# Patient Record
Sex: Female | Born: 1955 | Race: White | Hispanic: No | Marital: Married | Smoking: Former smoker
Health system: Southern US, Community
[De-identification: ages and names within clinical notes are randomized; demographics above are authoritative.]

## PROBLEM LIST (undated history)

## (undated) DIAGNOSIS — D249 Benign neoplasm of unspecified breast: Secondary | ICD-10-CM

## (undated) DIAGNOSIS — G479 Sleep disorder, unspecified: Secondary | ICD-10-CM

## (undated) DIAGNOSIS — M1990 Unspecified osteoarthritis, unspecified site: Secondary | ICD-10-CM

## (undated) DIAGNOSIS — F329 Major depressive disorder, single episode, unspecified: Secondary | ICD-10-CM

## (undated) DIAGNOSIS — F3181 Bipolar II disorder: Secondary | ICD-10-CM

## (undated) DIAGNOSIS — F32A Depression, unspecified: Secondary | ICD-10-CM

## (undated) DIAGNOSIS — J449 Chronic obstructive pulmonary disease, unspecified: Secondary | ICD-10-CM

## (undated) HISTORY — DX: Bipolar II disorder: F31.81

## (undated) HISTORY — DX: Chronic obstructive pulmonary disease, unspecified: J44.9

---

## 2014-01-22 ENCOUNTER — Ambulatory Visit: Payer: Self-pay

## 2014-01-22 LAB — URINALYSIS, COMPLETE

## 2014-01-25 ENCOUNTER — Ambulatory Visit: Payer: Self-pay | Admitting: Physician Assistant

## 2014-01-25 LAB — URINE CULTURE

## 2014-01-25 LAB — URINALYSIS, COMPLETE
Bacteria: NEGATIVE
RBC,UR: NONE SEEN /HPF (ref 0–5)

## 2014-02-14 ENCOUNTER — Ambulatory Visit: Payer: Self-pay | Admitting: Ophthalmology

## 2014-04-11 ENCOUNTER — Ambulatory Visit
Admit: 2014-04-11 | Disposition: A | Discharge status: Home / Self Care | Payer: Self-pay | Attending: Ophthalmology | Admitting: Ophthalmology

## 2014-05-07 NOTE — Op Note (Signed)
PATIENT NAME:  Christina Hurst, Christina Hurst MR#:  [id number] DATE OF BIRTH:  1956-01-05  DATE OF PROCEDURE:  02/14/2014  PREOPERATIVE DIAGNOSIS:  Nuclear sclerotic cataract of the right eye.   POSTOPERATIVE DIAGNOSIS:  Nuclear sclerotic cataract of the right eye.   OPERATIVE PROCEDURE:  Cataract extraction by phacoemulsification with implant of intraocular lens to right eye.   SURGEON:  Birder Robson, MD.   ANESTHESIA:  1. Managed anesthesia care.  2. Topical tetracaine drops followed by 2% Xylocaine jelly applied in the preoperative holding area.   COMPLICATIONS:  None.   TECHNIQUE:   Stop and chop.  DESCRIPTION OF PROCEDURE:  The patient was examined and consented in the preoperative holding area where the aforementioned topical anesthesia was applied to the right eye and then brought back to the Operating Room where the right eye was prepped and draped in the usual sterile ophthalmic fashion and a lid speculum was placed. A paracentesis was created with the side port blade and the anterior chamber was filled with viscoelastic. A near clear corneal incision was performed with the steel keratome. A continuous curvilinear capsulorrhexis was performed with a cystotome followed by the capsulorrhexis forceps. Hydrodissection and hydrodelineation were carried out with BSS on a blunt cannula. The lens was removed in a stop and chop technique and the remaining cortical material was removed with the irrigation-aspiration handpiece. The capsular bag was inflated with viscoelastic and the Tecnis ZCB00, 24.0-diopter lens, serial number [id number] was placed in the capsular bag without complication. The remaining viscoelastic was removed from the eye with the irrigation-aspiration handpiece. The wounds were hydrated. The anterior chamber was flushed with Miostat and the eye was inflated to physiologic pressure. 0.1 mL of cefuroxime concentration 10 mg/mL was placed in the anterior chamber. The wounds were found to be  water tight. The eye was dressed with Vigamox. The patient was given protective glasses to wear throughout the day and a shield with which to sleep tonight. The patient was also given drops with which to begin a drop regimen today and will follow-up with me in one day.    ____________________________ Livingston Diones. Ephram Kornegay, MD wlp:TM D: 02/14/2014 53:29:92 ET T: 02/14/2014 22:59:55 ET JOB#: [id number]  cc: Shonita Rinck L. Channa Hazelett, MD, <Dictator> Livingston Diones Jhovany Weidinger MD ELECTRONICALLY SIGNED 02/15/2014 12:12

## 2014-05-07 NOTE — Op Note (Signed)
PATIENT NAME:  [name], Christina Hurst MR#:  [id number] DATE OF BIRTH:  12-Jan-1955  DATE OF PROCEDURE:  04/11/2014  PREOPERATIVE DIAGNOSIS:  Nuclear sclerotic cataract of the left eye.   POSTOPERATIVE DIAGNOSIS:  Nuclear sclerotic cataract of the left eye.   OPERATIVE PROCEDURE:  Cataract extraction by phacoemulsification with implant of intraocular lens to left eye.   SURGEON:  Birder Robson, MD.   ANESTHESIA:  1. Managed anesthesia care.  2. Topical tetracaine drops followed by 2% Xylocaine jelly applied in the preoperative holding area.   COMPLICATIONS:  None.   TECHNIQUE:   Stop and chop.   DESCRIPTION OF PROCEDURE:  The patient was examined and consented in the preoperative holding area where the aforementioned topical anesthesia was applied to the left eye and then brought back to the Operating Room where the left eye was prepped and draped in the usual sterile ophthalmic fashion and a lid speculum was placed. A paracentesis was created with the side port blade and the anterior chamber was filled with viscoelastic. A near clear corneal incision was performed with the steel keratome. A continuous curvilinear capsulorrhexis was performed with a cystotome followed by the capsulorrhexis forceps. Hydrodissection and hydrodelineation were carried out with BSS on a blunt cannula. The lens was removed in a stop and chop technique and the remaining cortical material was removed with the irrigation-aspiration handpiece. The capsular bag was inflated with viscoelastic and the Tecnis ZCB00 24.0-diopter lens, serial number #[id number] was placed in the capsular bag without complication. The remaining viscoelastic was removed from the eye with the irrigation-aspiration handpiece. The wounds were hydrated. The anterior chamber was flushed with Miostat and the eye was inflated to physiologic pressure. 0.1 mL of cefuroxime concentration 10 mg/mL was placed in the anterior chamber. The wounds were found to be  water tight. The eye was dressed with Vigamox. The patient was given protective glasses to wear throughout the day and a shield with which to sleep tonight. The patient was also given drops with which to begin a drop regimen today and will follow-up with me in one day.    ____________________________ Livingston Diones. Bertice Risse, MD wlp:at D: 04/11/2014 12:35:24 ET T: 04/11/2014 13:13:06 ET JOB#: [id number]  cc: Keryl Gholson L. Tlaloc Taddei, MD, <Dictator> Livingston Diones Tonique Mendonca MD ELECTRONICALLY SIGNED 04/12/2014 12:34

## 2014-10-09 DIAGNOSIS — M797 Fibromyalgia: Secondary | ICD-10-CM | POA: Insufficient documentation

## 2015-01-11 ENCOUNTER — Ambulatory Visit
Admission: EM | Admit: 2015-01-11 | Discharge: 2015-01-11 | Disposition: A | Discharge status: Home / Self Care | Payer: Managed Care, Other (non HMO) | Attending: Family Medicine | Admitting: Family Medicine

## 2015-01-11 ENCOUNTER — Encounter: Payer: Self-pay | Admitting: Gynecology

## 2015-01-11 DIAGNOSIS — J45901 Unspecified asthma with (acute) exacerbation: Secondary | ICD-10-CM

## 2015-01-11 DIAGNOSIS — J209 Acute bronchitis, unspecified: Secondary | ICD-10-CM

## 2015-01-11 HISTORY — DX: Benign neoplasm of unspecified breast: D24.9

## 2015-01-11 HISTORY — DX: Major depressive disorder, single episode, unspecified: F32.9

## 2015-01-11 HISTORY — DX: Depression, unspecified: F32.A

## 2015-01-11 HISTORY — DX: Unspecified osteoarthritis, unspecified site: M19.90

## 2015-01-11 HISTORY — DX: Sleep disorder, unspecified: G47.9

## 2015-01-11 LAB — RAPID STREP SCREEN (MED CTR MEBANE ONLY): Streptococcus, Group A Screen (Direct): NEGATIVE

## 2015-01-11 MED ORDER — AZITHROMYCIN 500 MG PO TABS
ORAL_TABLET | ORAL | Status: DC
Start: 2015-01-11 — End: 2015-02-05

## 2015-01-11 MED ORDER — ALBUTEROL SULFATE HFA 108 (90 BASE) MCG/ACT IN AERS: 2.0000 | INHALATION_SPRAY | RESPIRATORY_TRACT | Status: DC | PRN

## 2015-01-11 MED ORDER — HYDROCOD POLST-CPM POLST ER 10-8 MG/5ML PO SUER: 5.0000 mL | Freq: Two times a day (BID) | ORAL | Status: DC | PRN

## 2015-01-11 NOTE — ED Provider Notes (Signed)
CSN: [id number]     Arrival date & time 01/11/15  1442 History   First MD Initiated Contact with Patient 01/11/15 [place]    Nurses notes were reviewed. Chief Complaint  Patient presents with  . Cough      Patient reports coughing for about a week. She stopped smoking about 15 years ago. States that she's been wheezing for the last 2 days coughing up greenish material well. States there was a fever but she's been running a temp up to the 100 lately as well. (Consider location/radiation/quality/duration/timing/severity/associated sxs/prior Treatment) Patient is a 61 y.o. female presenting with cough. The history is provided by the patient. No language interpreter was used.  Cough Cough characteristics:  Productive Sputum characteristics:  Yellow and green Severity:  Moderate Onset quality:  Sudden Progression:  Worsening Chronicity:  New Smoker: no   Context: upper respiratory infection   Context: not animal exposure, not exposure to allergens, not fumes, not occupational exposure, not sick contacts and not smoke exposure   Relieved by:  Nothing Ineffective treatments:  None tried Associated symptoms: fever, myalgias, shortness of breath and sinus congestion   Associated symptoms: no chest pain, no ear fullness, no rash, no rhinorrhea and no sore throat   Risk factors: recent infection     Past Medical History  Diagnosis Date  . Depression   . Sleep disorder   . Arthritis   . Fibroadenoma    Past Surgical History  Procedure Laterality Date  . Cesarean section     No family history on file. Social History  Substance Use Topics  . Smoking status: Never Smoker   . Smokeless tobacco: None  . Alcohol Use: Yes   OB History    No data available     Review of Systems  Constitutional: Positive for fever.  HENT: Negative for rhinorrhea and sore throat.   Respiratory: Positive for cough and shortness of breath.   Cardiovascular: Negative for chest pain.  Musculoskeletal:  Positive for myalgias.  Skin: Negative for rash.    Allergies  Review of patient's allergies indicates no known allergies.  Home Medications   Prior to Admission medications   Medication Sig Start Date End Date Taking? Authorizing Provider  albuterol (PROVENTIL HFA;VENTOLIN HFA) 108 (90 Base) MCG/ACT inhaler Inhale 2 puffs into the lungs every 4 (four) hours as needed for wheezing or shortness of breath. 01/11/15   Frederich Cha, MD  azithromycin (ZITHROMAX) 500 MG tablet 1 tablet daily 01/11/15   Frederich Cha, MD  chlorpheniramine-HYDROcodone [facility]) 10-8 MG/5ML SUER Take 5 mLs by mouth every 12 (twelve) hours as needed for cough. 01/11/15   Frederich Cha, MD   Meds Ordered and Administered this Visit  Medications - No data to display  BP 113/67 mmHg  Pulse 82  Temp(Src) 97.8 F (36.6 C) (Oral)  Resp 16  Ht 5\' 4"  (1.626 m)  Wt 150 lb (68.04 kg)  BMI 25.73 kg/m2  SpO2 99% No data found.   Physical Exam  Constitutional: She appears well-developed and well-nourished.  HENT:  Head: Normocephalic and atraumatic.  Eyes: Conjunctivae are normal. Pupils are equal, round, and reactive to light.  Neck: Normal range of motion. Neck supple.  Cardiovascular: Normal rate and regular rhythm.   Pulmonary/Chest: Effort normal. She has wheezes.  Musculoskeletal: Normal range of motion. She exhibits no edema.  Neurological: She is alert.  Skin: Skin is warm and dry.  Psychiatric: She has a normal mood and affect.  Vitals reviewed.  ED Course  Procedures (including critical care time)  Labs Review Labs Reviewed  RAPID STREP SCREEN (NOT AT [facility])  CULTURE, GROUP A STREP ([facility] ONLY)    Imaging Review No results found.   Visual Acuity Review  Right Eye Distance:   Left Eye Distance:   Bilateral Distance:    Right Eye Near:   Left Eye Near:    Bilateral Near:         MDM   1. Acute bronchitis, unspecified organism   2. Reactive airway disease with wheezing,  unspecified asthma severity, with acute exacerbation    Patient will be placed on Zithromax 500 mg 1 tablet daily for 5 days, Tussionex 1 teaspoon twice a day when necessary inhaler as well for bronchospasm. Follow-up PCP if needed and work note for today and tomorrow.    Frederich Cha, MD 01/11/15 [phone]

## 2015-01-11 NOTE — ED Notes (Signed)
Patient c/o cough / sore throat x 1 week. Per patient with fever x yesterday of 100.

## 2015-01-11 NOTE — Discharge Instructions (Signed)
Acute Bronchitis Bronchitis is when the airways that extend from the windpipe into the lungs get red, puffy, and painful (inflamed). Bronchitis often causes thick spit (mucus) to develop. This leads to a cough. A cough is the most common symptom of bronchitis. In acute bronchitis, the condition usually begins suddenly and goes away over time (usually in 2 weeks). Smoking, allergies, and asthma can make bronchitis worse. Repeated episodes of bronchitis may cause more lung problems. HOME CARE 1. Rest. 2. Drink enough fluids to keep your pee (urine) clear or pale yellow (unless you need to limit fluids as told by your doctor). 3. Only take over-the-counter or prescription medicines as told by your doctor. 4. Avoid smoking and secondhand smoke. These can make bronchitis worse. If you are a smoker, think about using nicotine gum or skin patches. Quitting smoking will help your lungs heal faster. 5. Reduce the chance of getting bronchitis again by: 1. Washing your hands often. 2. Avoiding people with cold symptoms. 3. Trying not to touch your hands to your mouth, nose, or eyes. 6. Follow up with your doctor as told. GET HELP IF: Your symptoms do not improve after 1 week of treatment. Symptoms include:  Cough.  Fever.  Coughing up thick spit.  Body aches.  Chest congestion.  Chills.  Shortness of breath.  Sore throat. GET HELP RIGHT AWAY IF:   You have an increased fever.  You have chills.  You have severe shortness of breath.  You have bloody thick spit (sputum).  You throw up (vomit) often.  You lose too much body fluid (dehydration).  You have a severe headache.  You faint. MAKE SURE YOU:   Understand these instructions.  Will watch your condition.  Will get help right away if you are not doing well or get worse.   This information is not intended to replace advice given to you by your health care provider. Make sure you discuss any questions you have with your  health care provider.   Document Released: 06/11/2007 Document Revised: 08/25/2012 Document Reviewed: 06/15/2012 Elsevier Interactive Patient Education 2016 [employer]  Cough, Adult A cough helps to clear your throat and lungs. A cough may last only 2-3 weeks (acute), or it may last longer than 8 weeks (chronic). Many different things can cause a cough. A cough may be a sign of an illness or another medical condition. HOME CARE 7. Pay attention to any changes in your cough. 8. Take medicines only as told by your doctor. 1. If you were prescribed an antibiotic medicine, take it as told by your doctor. Do not stop taking it even if you start to feel better. 2. Talk with your doctor before you try using a cough medicine. 9. Drink enough fluid to keep your pee (urine) clear or pale yellow. 10. If the air is dry, use a cold steam vaporizer or humidifier in your home. 11. Stay away from things that make you cough at work or at home. 12. If your cough is worse at night, try using extra pillows to raise your head up higher while you sleep. 13. Do not smoke, and try not to be around smoke. If you need help quitting, ask your doctor. 14. Do not have caffeine. 15. Do not drink alcohol. 16. Rest as needed. GET HELP IF:  You have new problems (symptoms).  You cough up yellow fluid (pus).  Your cough does not get better after 2-3 weeks, or your cough gets worse.  Medicine does not  help your cough and you are not sleeping well.  You have pain that gets worse or pain that is not helped with medicine.  You have a fever.  You are losing weight and you do not know why.  You have night sweats. GET HELP RIGHT AWAY IF:  You cough up blood.  You have trouble breathing.  Your heartbeat is very fast.   This information is not intended to replace advice given to you by your health care provider. Make sure you discuss any questions you have with your health care provider.   Document Released:  09/05/2010 Document Revised: 09/13/2014 Document Reviewed: 03/01/2014 Elsevier Interactive Patient Education 2016 [place] Dose Inhaler With Spacer Inhaled medicines are the basis of treatment of asthma and other breathing problems. Inhaled medicine can only be effective if used properly. Good technique assures that the medicine reaches the lungs. Your health care provider has asked you to use a spacer with your inhaler to help you take the medicine more effectively. A spacer is a plastic tube with a mouthpiece on one end and an opening that connects to the inhaler on the other end. Metered dose inhalers (MDIs) are used to deliver a variety of inhaled medicines. These include quick relief or rescue medicines (such as bronchodilators) and controller medicines (such as corticosteroids). The medicine is delivered by pushing down on a metal canister to release a set amount of spray. If you are using different kinds of inhalers, use your quick relief medicine to open the airways 10-15 minutes before using a steroid if instructed to do so by your health care provider. If you are unsure which inhalers to use and the order of using them, ask your health care provider, nurse, or respiratory therapist. HOW TO USE THE INHALER WITH A SPACER 17. Remove cap from inhaler. 18. If you are using the inhaler for the first time, you will need to prime it. Shake the inhaler for 5 seconds and release four puffs into the air, away from your face. Ask your health care provider or pharmacist if you have questions about priming your inhaler. 19. Shake inhaler for 5 seconds before each breath in (inhalation). 20. Place the open end of the spacer onto the mouthpiece of the inhaler. 21. Position the inhaler so that the top of the canister faces up and the spacer mouthpiece faces you. 22. Put your index finger on the top of the medicine canister. Your thumb supports the bottom of the inhaler and the  spacer. 23. Breathe out (exhale) normally and as completely as possible. 24. Immediately after exhaling, place the spacer between your teeth and into your mouth. Close your mouth tightly around the spacer. 25. Press the canister down with the index finger to release the medicine. 26. At the same time as the canister is pressed, inhale deeply and slowly until the lungs are completely filled. This should take 4-6 seconds. Keep your tongue down and out of the way. 27. Hold the medicine in your lungs for 5-10 seconds (10 seconds is best). This helps the medicine get into the small airways of your lungs. Exhale. 28. Repeat inhaling deeply through the spacer mouthpiece. Again hold that breath for up to 10 seconds (10 seconds is best). Exhale slowly. If it is difficult to take this second deep breath through the spacer, breathe normally several times through the spacer. Remove the spacer from your mouth. 29. Wait at least 15-30 seconds between puffs. Continue with the above steps until  you have taken the number of puffs your health care provider has ordered. Do not use the inhaler more than your health care provider directs you to. 30. Remove spacer from the inhaler and place cap on inhaler. 31. Follow the directions from your health care provider or the inhaler insert for cleaning the inhaler and spacer. If you are using a steroid inhaler, rinse your mouth with water after your last puff, gargle, and spit out the water. Do not swallow the water. AVOID:  Inhaling before or after starting the spray of medicine. It takes practice to coordinate your breathing with triggering the spray.  Inhaling through the nose (rather than the mouth) when triggering the spray. HOW TO DETERMINE IF YOUR INHALER IS FULL OR NEARLY EMPTY You cannot know when an inhaler is empty by shaking it. A few inhalers are now being made with dose counters. Ask your health care provider for a prescription that has a dose counter if you feel  you need that extra help. If your inhaler does not have a counter, ask your health care provider to help you determine the date you need to refill your inhaler. Write the refill date on a calendar or your inhaler canister. Refill your inhaler 7-10 days before it runs out. Be sure to keep an adequate supply of medicine. This includes making sure it is not expired, and you have a spare inhaler.  SEEK MEDICAL CARE IF:   Symptoms are only partially relieved with your inhaler.  You are having trouble using your inhaler.  You experience some increase in phlegm. SEEK IMMEDIATE MEDICAL CARE IF:   You feel little or no relief with your inhalers. You are still wheezing and are feeling shortness of breath or tightness in your chest or both.  You have dizziness, headaches, or fast heart rate.  You have chills, fever, or night sweats.  There is a noticeable increase in phlegm production, or there is blood in the phlegm.   This information is not intended to replace advice given to you by your health care provider. Make sure you discuss any questions you have with your health care provider.   Document Released: 12/23/2004 Document Revised: 05/09/2014 Document Reviewed: 06/10/2012 Elsevier Interactive Patient Education [employer].

## 2015-01-13 LAB — CULTURE, GROUP A STREP (THRC)

## 2015-01-15 ENCOUNTER — Telehealth: Payer: Self-pay

## 2015-01-15 NOTE — ED Notes (Signed)
This nurse attempted to contact patient to inform her that Strep test was negative. No answer or answering machine

## 2015-02-05 ENCOUNTER — Encounter: Payer: Self-pay | Admitting: *Deleted

## 2015-02-05 ENCOUNTER — Ambulatory Visit
Admission: EM | Admit: 2015-02-05 | Discharge: 2015-02-05 | Disposition: A | Discharge status: Home / Self Care | Payer: Managed Care, Other (non HMO) | Attending: Family Medicine | Admitting: Family Medicine

## 2015-02-05 DIAGNOSIS — H6593 Unspecified nonsuppurative otitis media, bilateral: Secondary | ICD-10-CM | POA: Diagnosis not present

## 2015-02-05 DIAGNOSIS — J441 Chronic obstructive pulmonary disease with (acute) exacerbation: Secondary | ICD-10-CM

## 2015-02-05 DIAGNOSIS — J019 Acute sinusitis, unspecified: Secondary | ICD-10-CM | POA: Diagnosis not present

## 2015-02-05 MED ORDER — HYDROCOD POLST-CPM POLST ER 10-8 MG/5ML PO SUER: 5.0000 mL | Freq: Every evening | ORAL | Status: DC | PRN

## 2015-02-05 MED ORDER — FLUTICASONE PROPIONATE 50 MCG/ACT NA SUSP: 1.0000 | Freq: Two times a day (BID) | NASAL | Status: DC

## 2015-02-05 MED ORDER — SALINE SPRAY 0.65 % NA SOLN: 2.0000 | NASAL | Status: DC

## 2015-02-05 MED ORDER — LORATADINE 10 MG PO TABS: 10.0000 mg | ORAL_TABLET | Freq: Every day | ORAL | Status: DC

## 2015-02-05 MED ORDER — PREDNISONE 50 MG PO TABS: 50.0000 mg | ORAL_TABLET | Freq: Every day | ORAL | Status: AC

## 2015-02-05 MED ORDER — IPRATROPIUM-ALBUTEROL 0.5-2.5 (3) MG/3ML IN SOLN
3.0000 mL | Freq: Four times a day (QID) | RESPIRATORY_TRACT | Status: DC
  Administered 2015-02-05: 3 mL via RESPIRATORY_TRACT

## 2015-02-05 MED ORDER — CEFACLOR 500 MG PO CAPS: 500.0000 mg | ORAL_CAPSULE | Freq: Two times a day (BID) | ORAL | Status: AC

## 2015-02-05 NOTE — ED Provider Notes (Signed)
CSN: [id number]     Arrival date & time 02/05/15  1518 History   First MD Initiated Contact with Patient 02/05/15 1609     Chief Complaint  Patient presents with  . Cough  . Shortness of Breath  . 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 female presenting with cough and shortness of breath. The history is provided by the patient.  Cough Cough characteristics:  Productive Sputum characteristics:  Yellow Severity:  Severe Onset quality:  Sudden Duration:  2 days Timing:  Constant Progression:  Worsening Chronicity:  Recurrent Smoker: no   Context: exposure to allergens, sick contacts, upper respiratory infection, weather changes and with activity   Context: not animal exposure, not fumes, not occupational exposure and not smoke exposure   Relieved by:  Nothing Worsened by:  Activity, deep breathing, environmental changes, exposure to cold air and lying down Ineffective treatments:  Ipratropium inhaler, rest, steam and fluids Associated symptoms: chills, diaphoresis, ear fullness, headaches, myalgias, rhinorrhea, shortness of breath, sinus congestion, sore throat and wheezing   Associated symptoms: no chest pain, no ear pain, no eye discharge, no fever, no rash and no weight loss   Headaches:    Severity:  Mild   Onset quality:  Sudden   Duration:  2 days   Timing:  Intermittent   Progression:  Worsening   Chronicity:  New Rhinorrhea:    Quality:  Clear and yellow   Severity:  Moderate   Duration:  2 days   Timing:  Constant    Progression:  Worsening Shortness of breath:    Severity:  Mild   Onset quality:  Sudden   Duration:  2 days   Timing:  Intermittent   Progression:  Worsening Sore throat:    Severity:  Moderate   Onset quality:  Sudden   Duration:  2 days   Timing:  Constant   Progression:  Worsening Wheezing:    Severity:  Mild   Onset quality:  Sudden   Duration:  2 days   Timing:  Intermittent   Progression:  Worsening   Chronicity:  New Risk factors: recent infection   Risk factors: no chemical exposure and no recent travel   Shortness of Breath Associated symptoms: cough, diaphoresis, headaches, sore throat and wheezing   Associated symptoms: no abdominal pain, no chest pain, no ear pain, no fever, no neck pain, no rash and no vomiting     Past Medical History  Diagnosis Date  . Depression   . Sleep disorder   . Arthritis   . Fibroadenoma    Past Surgical History  Procedure Laterality Date  . Cesarean section     History reviewed. No pertinent family history. Social History  Substance Use Topics  . Smoking status: Never Smoker   . Smokeless tobacco: None  . Alcohol Use: Yes   OB History    No data available     Review of Systems  Constitutional: Positive for chills and diaphoresis. Negative for fever, weight loss, activity change, appetite change, fatigue and unexpected weight change.  HENT: Positive for congestion, nosebleeds, postnasal drip, rhinorrhea, sinus pressure and sore throat.  Negative for dental problem, drooling, ear discharge, ear pain, facial swelling, hearing loss, mouth sores, sneezing, tinnitus, trouble swallowing and voice change.   Eyes: Negative for photophobia, pain, discharge, redness, itching and visual disturbance.  Respiratory: Positive for cough, chest tightness, shortness of breath and wheezing. Negative for choking and stridor.   Cardiovascular: Negative for chest pain, palpitations and leg swelling.  Gastrointestinal: Negative for nausea,  vomiting, abdominal pain, diarrhea, constipation, blood in stool and abdominal distention.  Endocrine: Negative for cold intolerance and heat intolerance.  Genitourinary: Negative for dysuria, hematuria and difficulty urinating.  Musculoskeletal: Positive for myalgias. Negative for back pain, joint swelling, arthralgias, gait problem, neck pain and neck stiffness.  Skin: Negative for color change, pallor, rash and wound.  Allergic/Immunologic: Positive for environmental allergies. Negative for food allergies.  Neurological: Positive for headaches. Negative for dizziness, tremors, seizures, syncope, facial asymmetry, speech difficulty, weakness, light-headedness and numbness.  Hematological: Negative for adenopathy. Does not bruise/bleed easily.  Psychiatric/Behavioral: Positive for sleep disturbance. Negative for behavioral problems, confusion and agitation.    Allergies  Review of patient's allergies indicates no known allergies.  Home Medications   Prior to Admission medications   Medication Sig Start Date End Date Taking? Authorizing Provider  albuterol (PROVENTIL HFA;VENTOLIN HFA) 108 (90 Base) MCG/ACT inhaler Inhale 2 puffs into the lungs every 4 (four) hours as needed for wheezing or shortness of breath. 01/11/15  Yes Frederich Cha, MD  amitriptyline (ELAVIL) 75 MG tablet Take 75 mg by mouth at bedtime.   Yes Historical Provider, MD  celecoxib (CELEBREX) 200 MG capsule Take 200 mg by mouth 2 (two) times daily.   Yes Historical Provider, MD  lamoTRIgine (LAMICTAL) 100 MG tablet Take 100 mg by mouth daily.   Yes Historical Provider, MD  Milnacipran HCl (SAVELLA) 100 MG TABS tablet Take 100 mg by mouth 2 (two) times daily.   Yes Historical Provider, MD  traZODone (DESYREL) 100 MG tablet Take 100 mg by mouth at bedtime.   Yes Historical Provider, MD  Vortioxetine HBr (TRINTELLIX) 20 MG TABS Take 20 mg by mouth daily.   Yes Historical Provider, MD  chlorpheniramine-HYDROcodone (TUSSIONEX  PENNKINETIC ER) 10-8 MG/5ML SUER Take 5 mLs by mouth at bedtime as needed for cough. 02/05/15   Olen Cordial, NP  fluticasone (FLONASE) 50 MCG/ACT nasal spray Place 1 spray into both nostrils 2 (two) times daily. 02/05/15   Olen Cordial, NP  loratadine (CLARITIN) 10 MG tablet Take 1 tablet (10 mg total) by mouth daily. 02/05/15   Olen Cordial, NP  predniSONE (DELTASONE) 50 MG tablet Take 1 tablet (50 mg total) by mouth daily with breakfast. 02/06/15 02/10/15  Olen Cordial, NP  sodium chloride (OCEAN) 0.65 % SOLN nasal spray Place 2 sprays into both nostrils every 2 (two) hours while awake. 02/05/15   Olen Cordial, NP   Meds Ordered and Administered this Visit   Medications  ipratropium-albuterol (DUONEB) 0.5-2.5 (3) MG/3ML nebulizer solution 3 mL (3 mLs Nebulization Given 02/05/15 1627)    BP 143/74 mmHg  Pulse 99  Temp(Src) 98.5 F (36.9 C) (Tympanic)  Resp 20  Ht 5\' 4"  (1.626 m)  Wt 145 lb (65.772 kg)  BMI 24.88 kg/m2  SpO2 98% No data found.   Physical Exam  Constitutional: She is oriented to person, place, and time. She appears well-developed and well-nourished. She is active and cooperative.  Non-toxic appearance. She does not have a sickly appearance. She appears ill. No distress.  HENT:  Head:  Normocephalic and atraumatic.  Right Ear: Hearing, external ear and ear canal normal. A middle ear effusion is present.  Left Ear: Hearing, external ear and ear canal normal. A middle ear effusion is present.  Nose: Mucosal edema and rhinorrhea present. No nose lacerations, sinus tenderness, nasal deformity, septal deviation or nasal septal hematoma. No epistaxis.  No foreign bodies. Right sinus exhibits no maxillary sinus tenderness and no frontal sinus tenderness. Left sinus exhibits no maxillary sinus tenderness and no frontal sinus tenderness.  Mouth/Throat: Uvula is midline and mucous membranes are normal. Mucous membranes are not pale, not dry and not cyanotic. She  does not have dentures. No oral lesions. No trismus in the jaw. Normal dentition. No dental abscesses, uvula swelling, lacerations or dental caries. Posterior oropharyngeal edema and posterior oropharyngeal erythema present. No oropharyngeal exudate or tonsillar abscesses.  Cobblestoning posterior pharynx; bilateral nasal turbinates with edema/erythema yellow discharge; bilateral TMs with air fluid level slight opacity right greater than left  Eyes: Conjunctivae, EOM and lids are normal. Pupils are equal, round, and reactive to light. Right eye exhibits no chemosis, no discharge, no exudate and no hordeolum. No foreign body present in the right eye. Left eye exhibits no chemosis, no discharge, no exudate and no hordeolum. No foreign body present in the left eye. Right conjunctiva is not injected. Right conjunctiva has no hemorrhage. Left conjunctiva is not injected. Left conjunctiva has no hemorrhage. No scleral icterus. Right eye exhibits normal extraocular motion and no nystagmus. Left eye exhibits normal extraocular motion and no nystagmus. Right pupil is round and reactive. Left pupil is round and reactive. Pupils are equal.  Neck: Trachea normal and normal range of motion. Neck supple. No tracheal tenderness, no spinous process tenderness and no muscular tenderness present. No rigidity. No tracheal deviation, no edema, no erythema and normal range of motion present. No thyroid mass and no thyromegaly present.  Cardiovascular: Normal rate, regular rhythm, S1 normal, S2 normal, normal heart sounds and intact distal pulses.  PMI is not displaced.  Exam reveals no gallop and no friction rub.   No murmur heard. Pulmonary/Chest: Effort normal. No accessory muscle usage or stridor. No respiratory distress. She has decreased breath sounds in the right lower field and the left lower field. She has no wheezes. She has no rhonchi. She has no rales. She exhibits no tenderness.  Harsh cough with deep breaths unable  to speak for 30 seconds to 1 minute after coughing spells prior to nebulizer treatment  Abdominal: Soft. She exhibits no distension.  Musculoskeletal: Normal range of motion. She exhibits no edema or tenderness.       Right shoulder: Normal.       Left shoulder: Normal.       Right hip: Normal.       Left hip: Normal.       Right knee: Normal.       Left knee: Normal.       Cervical back: Normal.       Right hand: Normal.       Left hand: Normal.  Lymphadenopathy:       Head (right side): No submental, no submandibular, no tonsillar, no preauricular, no posterior auricular and no occipital adenopathy present.       Head (left side): No submental, no submandibular, no tonsillar, no preauricular, no posterior auricular and no occipital adenopathy present.    She has no cervical adenopathy.       Right cervical: No superficial cervical, no deep cervical  and no posterior cervical adenopathy present.      Left cervical: No superficial cervical, no deep cervical and no posterior cervical adenopathy present.  Neurological: She is alert and oriented to person, place, and time. She has normal strength. She is not disoriented. She displays no atrophy and no tremor. No cranial nerve deficit or sensory deficit. She exhibits normal muscle tone. She displays no seizure activity. Coordination and gait normal. GCS eye subscore is 4. GCS verbal subscore is 5. GCS motor subscore is 6.  Skin: Skin is warm, dry and intact. No abrasion, no bruising, no burn, no ecchymosis, no laceration, no lesion, no petechiae and no rash noted. She is not diaphoretic. No cyanosis or erythema. No pallor. Nails show no clubbing.  Psychiatric: She has a normal mood and affect. Her speech is normal and behavior is normal. Judgment and thought content normal. Cognition and memory are normal.  Nursing note and vitals reviewed.   ED Course  Procedures (including critical care time)  Labs Review Labs Reviewed - No data to  display  Imaging Review No results found.   1627 duoneb 68ml administered by RN Janine Ores.  1645 re-evaluated patient after duoneb spo2 98% room air cough much decreased patient able to speak in full sentences without coughing drank 1 cup of water.  Improved airflow to bilateral lower lung fields negative egophany.  MDM   1. Acute rhinosinusitis   2. Otitis media with effusion, bilateral   3. COPD with exacerbation ([place])      Supportive treatment.   No evidence of invasive bacterial infection, non toxic and well hydrated.  This is most likely self limiting viral infection.  I do not see where any further testing or imaging is necessary at this time.   I will suggest supportive care, rest, good hygiene and encourage the patient to take adequate fluids.  The patient is to return to clinic or EMERGENCY ROOM if symptoms worsen or change significantly e.g. ear pain, fever, purulent discharge from ears or bleeding.  Exitcare handout on otitis media with effusion given to patient.  Patient verbalized agreement and understanding of treatment plan.    Suspect Viral illness: no evidence of invasive bacterial infection, non toxic and well hydrated.  This is most likely self limiting viral infection.  I do not see where any further testing or imaging is necessary at this time.   I will suggest supportive care, rest, good hygiene and encourage the patient to take adequate fluids. 48 hours work excuse. flonase 1 spray each nostril BID prn, nasal saline 1-2 sprays each nostril prn q2h  Discussed honey with lemon and salt water gargles for comfort also.  The patient is to return to clinic or EMERGENCY ROOM if symptoms worsen or change significantly e.g. fever, lethargy, SOB, wheezing.  Exitcare handout on viral illness given to patient.  Patient verbalized agreement and understanding of treatment plan.    Start flonase 1 spray each nostril BID, nasal saline 2 sprays each nostril q2h prn congestion, restart  claritin 10mg  po daily as plants budding in local area typically spring allergies.  If no improvement with prescribed therapy x 48 hours start ceclor 500mg  po BID x 10 days. Rx given.  No evidence of systemic bacterial infection, non toxic and well hydrated.  I do not see where any further testing or imaging is necessary at this time.   I will suggest supportive care, rest, good hygiene and encourage the patient to take adequate fluids.  The  patient is to return to clinic or EMERGENCY ROOM if symptoms worsen or change significantly.  Exitcare handout on sinusitis given to patient.  Patient verbalized agreement and understanding of treatment plan and had no further questions at this time.   P2:  Hand washing and cover cough  Continue albuterol 1-2 puffs po q4-6h prn chest tightness, sob, wheezing discussed to use on regular schedule this week at least BID.  Prednisone 50mg  po daily with breakfast start in am.  tussionex 16ml at bedtime avoid alcohol/driving after taking this medication as can cause drowsiness.  Ceclor 500mg  po BID x 10 days if no improvement with 48 hours of prednisone and albuterol MDI.  Bronchitis simple, community acquired, may have started as viral (probably respiratory syncytial, parainfluenza, influenza, or adenovirus), but now evidence of acute purulent bronchitis with resultant bronchial edema and mucus formation.  Viruses are the most common cause of bronchial inflammation in otherwise healthy adults with acute bronchitis.  The appearance of sputum is not predictive of whether a bacterial infection is present.  Purulent sputum is most often caused by viral infections.  There are a small portion of those caused by non-viral agents being Mycoplamsa pneumonia.  Microscopic examination or C&S of sputum in the healthy adult with acute bronchitis is generally not helpful (usually negative or normal respiratory flora) other considerations being cough from upper respiratory tract infections,  sinusitis or allergic syndromes (mild asthma or viral pneumonia).  Differential Diagnosis:  reactive airway disease (asthma, allergic aspergillosis (eosinophilia), chronic bronchitis, respiratory infection (Sinusitis, Common cold, pneumonia), congestive heart failure, reflux esophagitis, bronchogenic tumor, aspiration syndromes and/or exposure irritants/tobacco smoke.  In this case, there is no evidence of any invasive bacterial illness.  Most likely viral etiology so will hold on antibiotic treatment.  Advise supportive care with rest, encourage fluids, good hygiene and watch for any worsening symptoms.  If they were to develop:  come back to the office or go to the emergency room if after hours. Without high fever, severe dyspnea, lack of physical findings or other risk factors, I will hold on a chest radiograph and CBC at this time. I discussed that approximately 50% of patients with acute bronchitis have a cough that lasts up to three weeks, and 25% for over a month.  Tylenol, one to two tablets every four hours as needed for fever or myalgias.   No aspirin.  Patient instructed to follow up in one week or sooner if symptoms worsen. Patient verbalized agreement and understanding of treatment plan.  P2:  hand washing and cover cough    Olen Cordial, NP 02/05/15 1823

## 2015-02-05 NOTE — Discharge Instructions (Signed)
Allergic Rhinitis Allergic rhinitis is when the mucous membranes in the nose respond to allergens. Allergens are particles in the air that cause your body to have an allergic reaction. This causes you to release allergic antibodies. Through a chain of events, these eventually cause you to release histamine into the blood stream. Although meant to protect the body, it is this release of histamine that causes your discomfort, such as frequent sneezing, congestion, and an itchy, runny nose.  CAUSES Seasonal allergic rhinitis (hay fever) is caused by pollen allergens that may come from grasses, trees, and weeds. Year-round allergic rhinitis (perennial allergic rhinitis) is caused by allergens such as house dust mites, pet dander, and mold spores. SYMPTOMS Nasal stuffiness (congestion). Itchy, runny nose with sneezing and tearing of the eyes. DIAGNOSIS Your health care provider can help you determine the allergen or allergens that trigger your symptoms. If you and your health care provider are unable to determine the allergen, skin or blood testing may be used. Your health care provider will diagnose your condition after taking your health history and performing a physical exam. Your health care provider may assess you for other related conditions, such as asthma, pink eye, or an ear infection. TREATMENT Allergic rhinitis does not have a cure, but it can be controlled by: Medicines that block allergy symptoms. These may include allergy shots, nasal sprays, and oral antihistamines. Avoiding the allergen. Hay fever may often be treated with antihistamines in pill or nasal spray forms. Antihistamines block the effects of histamine. There are over-the-counter medicines that may help with nasal congestion and swelling around the eyes. Check with your health care provider before taking or giving this medicine. If avoiding the allergen or the medicine prescribed do not work, there are many new medicines your  health care provider can prescribe. Stronger medicine may be used if initial measures are ineffective. Desensitizing injections can be used if medicine and avoidance does not work. Desensitization is when a patient is given ongoing shots until the body becomes less sensitive to the allergen. Make sure you follow up with your health care provider if problems continue. HOME CARE INSTRUCTIONS It is not possible to completely avoid allergens, but you can reduce your symptoms by taking steps to limit your exposure to them. It helps to know exactly what you are allergic to so that you can avoid your specific triggers. SEEK MEDICAL CARE IF: You have a fever. You develop a cough that does not stop easily (persistent). You have shortness of breath. You start wheezing. Symptoms interfere with normal daily activities.   This information is not intended to replace advice given to you by your health care provider. Make sure you discuss any questions you have with your health care provider.   Document Released: 09/17/2000 Document Revised: 01/13/2014 Document Reviewed: 08/30/2012 Elsevier Interactive Patient Education 2016 [employer]  Upper Respiratory Infection, Adult Most upper respiratory infections (URIs) are a viral infection of the air passages leading to the lungs. A URI affects the nose, throat, and upper air passages. The most common type of URI is nasopharyngitis and is typically referred to as "the common cold." URIs run their course and usually go away on their own. Most of the time, a URI does not require medical attention, but sometimes a bacterial infection in the upper airways can follow a viral infection. This is called a secondary infection. Sinus and middle ear infections are common types of secondary upper respiratory infections. Bacterial pneumonia can also complicate a URI. A  URI can worsen asthma and chronic obstructive pulmonary disease (COPD). Sometimes, these complications can  require emergency medical care and may be life threatening.  CAUSES Almost all URIs are caused by viruses. A virus is a type of germ and can spread from one person to another.  RISKS FACTORS You may be at risk for a URI if:  You smoke.  You have chronic heart or lung disease. You have a weakened defense (immune) system.  You are very young or very old.  You have nasal allergies or asthma. You work in crowded or poorly ventilated areas. You work in health care facilities or schools. SIGNS AND SYMPTOMS  Symptoms typically develop 2-3 days after you come in contact with a cold virus. Most viral URIs last 7-10 days. However, viral URIs from the influenza virus (flu virus) can last 14-18 days and are typically more severe. Symptoms may include:  Runny or stuffy (congested) nose.  Sneezing.  Cough.  Sore throat.  Headache.  Fatigue.  Fever.  Loss of appetite.  Pain in your forehead, behind your eyes, and over your cheekbones (sinus pain). Muscle aches.  DIAGNOSIS  Your health care provider may diagnose a URI by: Physical exam. Tests to check that your symptoms are not due to another condition such as: Strep throat. Sinusitis. Pneumonia. Asthma. TREATMENT  A URI goes away on its own with time. It cannot be cured with medicines, but medicines may be prescribed or recommended to relieve symptoms. Medicines may help: Reduce your fever. Reduce your cough. Relieve nasal congestion. HOME CARE INSTRUCTIONS  Take medicines only as directed by your health care provider.  Gargle warm saltwater or take cough drops to comfort your throat as directed by your health care provider. Use a warm mist humidifier or inhale steam from a shower to increase air moisture. This may make it easier to breathe. Drink enough fluid to keep your urine clear or pale yellow.  Eat soups and other clear broths and maintain good nutrition.  Rest as needed.  Return to work when your temperature has  returned to normal or as your health care provider advises. You may need to stay home longer to avoid infecting others. You can also use a face mask and careful hand washing to prevent spread of the virus. Increase the usage of your inhaler if you have asthma.  Do not use any tobacco products, including cigarettes, chewing tobacco, or electronic cigarettes. If you need help quitting, ask your health care provider. PREVENTION  The best way to protect yourself from getting a cold is to practice good hygiene.  Avoid oral or hand contact with people with cold symptoms.  Wash your hands often if contact occurs.  There is no clear evidence that vitamin C, vitamin E, echinacea, or exercise reduces the chance of developing a cold. However, it is always recommended to get plenty of rest, exercise, and practice good nutrition.  SEEK MEDICAL CARE IF:  You are getting worse rather than better.  Your symptoms are not controlled by medicine.  You have chills. You have worsening shortness of breath. You have brown or red mucus. You have yellow or brown nasal discharge. You have pain in your face, especially when you bend forward. You have a fever. You have swollen neck glands. You have pain while swallowing. You have white areas in the back of your throat. SEEK IMMEDIATE MEDICAL CARE IF:  You have severe or persistent: Headache. Ear pain. Sinus pain. Chest pain. You have chronic lung  disease and any of the following: Wheezing. Prolonged cough. Coughing up blood. A change in your usual mucus. You have a stiff neck. You have changes in your: Vision. Hearing. Thinking. Mood. MAKE SURE YOU:  Understand these instructions. Will watch your condition. Will get help right away if you are not doing well or get worse.   This information is not intended to replace advice given to you by your health care provider. Make sure you discuss any questions you have with your health care provider.     Document Released: 06/18/2000 Document Revised: 05/09/2014 Document Reviewed: 03/30/2013 Elsevier Interactive Patient Education 2016 [employer]. Otitis Media With Effusion Otitis media with effusion is the presence of fluid in the middle ear. This is a common problem in children, which often follows ear infections. It may be present for weeks or longer after the infection. Unlike an acute ear infection, otitis media with effusion refers only to fluid behind the ear drum and not infection. Children with repeated ear and sinus infections and allergy problems are the most likely to get otitis media with effusion. CAUSES  The most frequent cause of the fluid buildup is dysfunction of the eustachian tubes. These are the tubes that drain fluid in the ears to the back of the nose (nasopharynx). SYMPTOMS  The main symptom of this condition is hearing loss. As a result, you or your child may: Listen to the TV at a loud volume. Not respond to questions. Ask "what" often when spoken to. Mistake or confuse one sound or word for another. There may be a sensation of fullness or pressure but usually not pain. DIAGNOSIS  Your health care provider will diagnose this condition by examining you or your child's ears. Your health care provider may test the pressure in you or your child's ear with a tympanometer. A hearing test may be conducted if the problem persists. TREATMENT  Treatment depends on the duration and the effects of the effusion. Antibiotics, decongestants, nose drops, and cortisone-type drugs (tablets or nasal spray) may not be helpful. Children with persistent ear effusions may have delayed language or behavioral problems. Children at risk for developmental delays in hearing, learning, and speech may require referral to a specialist earlier than children not at risk. You or your child's health care provider may suggest a referral to an ear, nose, and throat surgeon for treatment. The following  may help restore normal hearing: Drainage of fluid. Placement of ear tubes (tympanostomy tubes). Removal of adenoids (adenoidectomy). HOME CARE INSTRUCTIONS  Avoid secondhand smoke. Infants who are breastfed are less likely to have this condition. Avoid feeding infants while they are lying flat. Avoid known environmental allergens. Avoid people who are sick. SEEK MEDICAL CARE IF:  Hearing is not better in 3 months. Hearing is worse. Ear pain. Drainage from the ear. Dizziness. MAKE SURE YOU:  Understand these instructions. Will watch your condition. Will get help right away if you are not doing well or get worse.   This information is not intended to replace advice given to you by your health care provider. Make sure you discuss any questions you have with your health care provider.   Document Released: 01/31/2004 Document Revised: 01/13/2014 Document Reviewed: 07/20/2012 Elsevier Interactive Patient Education 2016 [employer] Inc[employer]  Sinusitis, Adult Sinusitis is redness, soreness, and inflammation of the paranasal sinuses. Paranasal sinuses are air pockets within the bones of your face. They are located beneath your eyes, in the middle of your forehead, and above your eyes. In  healthy paranasal sinuses, mucus is able to drain out, and air is able to circulate through them by way of your nose. However, when your paranasal sinuses are inflamed, mucus and air can become trapped. This can allow bacteria and other germs to grow and cause infection. Sinusitis can develop quickly and last only a short time (acute) or continue over a long period (chronic). Sinusitis that lasts for more than 12 weeks is considered chronic. CAUSES Causes of sinusitis include:  Allergies.  Structural abnormalities, such as displacement of the cartilage that separates your nostrils (deviated septum), which can decrease the air flow through your nose and sinuses and affect sinus drainage.  Functional  abnormalities, such as when the small hairs (cilia) that line your sinuses and help remove mucus do not work properly or are not present. SIGNS AND SYMPTOMS Symptoms of acute and chronic sinusitis are the same. The primary symptoms are pain and pressure around the affected sinuses. Other symptoms include:  Upper toothache.  Earache.  Headache.  Bad breath.  Decreased sense of smell and taste.  A cough, which worsens when you are lying flat.  Fatigue.  Fever.  Thick drainage from your nose, which often is green and may contain pus (purulent).  Swelling and warmth over the affected sinuses. DIAGNOSIS Your health care provider will perform a physical exam. During your exam, your health care provider may perform any of the following to help determine if you have acute sinusitis or chronic sinusitis:  Look in your nose for signs of abnormal growths in your nostrils (nasal polyps).  Tap over the affected sinus to check for signs of infection.  View the inside of your sinuses using an imaging device that has a light attached (endoscope). If your health care provider suspects that you have chronic sinusitis, one or more of the following tests may be recommended:  Allergy tests.  Nasal culture. A sample of mucus is taken from your nose, sent to a lab, and screened for bacteria.  Nasal cytology. A sample of mucus is taken from your nose and examined by your health care provider to determine if your sinusitis is related to an allergy. TREATMENT Most cases of acute sinusitis are related to a viral infection and will resolve on their own within 10 days. Sometimes, medicines are prescribed to help relieve symptoms of both acute and chronic sinusitis. These may include pain medicines, decongestants, nasal steroid sprays, or saline sprays. However, for sinusitis related to a bacterial infection, your health care provider will prescribe antibiotic medicines. These are medicines that will help  kill the bacteria causing the infection. Rarely, sinusitis is caused by a fungal infection. In these cases, your health care provider will prescribe antifungal medicine. For some cases of chronic sinusitis, surgery is needed. Generally, these are cases in which sinusitis recurs more than 3 times per year, despite other treatments. HOME CARE INSTRUCTIONS  Drink plenty of water. Water helps thin the mucus so your sinuses can drain more easily.  Use a humidifier.  Inhale steam 3-4 times a day (for example, sit in the bathroom with the shower running).  Apply a warm, moist washcloth to your face 3-4 times a day, or as directed by your health care provider.  Use saline nasal sprays to help moisten and clean your sinuses.  Take medicines only as directed by your health care provider.  If you were prescribed either an antibiotic or antifungal medicine, finish it all even if you start to feel better. SEEK IMMEDIATE  MEDICAL CARE IF:  You have increasing pain or severe headaches.  You have nausea, vomiting, or drowsiness.  You have swelling around your face.  You have vision problems.  You have a stiff neck.  You have difficulty breathing.   This information is not intended to replace advice given to you by your health care provider. Make sure you discuss any questions you have with your health care provider.   Document Released: 12/23/2004 Document Revised: 01/13/2014 Document Reviewed: 01/07/2011 Elsevier Interactive Patient Education 2016 [employer] Inc[employer] Acute Bronchitis Bronchitis is inflammation of the airways that extend from the windpipe into the lungs (bronchi). The inflammation often causes mucus to develop. This leads to a cough, which is the most common symptom of bronchitis.  In acute bronchitis, the condition usually develops suddenly and goes away over time, usually in a couple weeks. Smoking, allergies, and asthma can make bronchitis worse. Repeated episodes of bronchitis  may cause further lung problems.  CAUSES Acute bronchitis is most often caused by the same virus that causes a cold. The virus can spread from person to person (contagious) through coughing, sneezing, and touching contaminated objects. SIGNS AND SYMPTOMS   Cough.   Fever.   Coughing up mucus.   Body aches.   Chest congestion.   Chills.   Shortness of breath.   Sore throat.  DIAGNOSIS  Acute bronchitis is usually diagnosed through a physical exam. Your health care provider will also ask you questions about your medical history. Tests, such as chest X-rays, are sometimes done to rule out other conditions.  TREATMENT  Acute bronchitis usually goes away in a couple weeks. Oftentimes, no medical treatment is necessary. Medicines are sometimes given for relief of fever or cough. Antibiotic medicines are usually not needed but may be prescribed in certain situations. In some cases, an inhaler may be recommended to help reduce shortness of breath and control the cough. A cool mist vaporizer may also be used to help thin bronchial secretions and make it easier to clear the chest.  HOME CARE INSTRUCTIONS  Get plenty of rest.   Drink enough fluids to keep your urine clear or pale yellow (unless you have a medical condition that requires fluid restriction). Increasing fluids may help thin your respiratory secretions (sputum) and reduce chest congestion, and it will prevent dehydration.   Take medicines only as directed by your health care provider.  If you were prescribed an antibiotic medicine, finish it all even if you start to feel better.  Avoid smoking and secondhand smoke. Exposure to cigarette smoke or irritating chemicals will make bronchitis worse. If you are a smoker, consider using nicotine gum or skin patches to help control withdrawal symptoms. Quitting smoking will help your lungs heal faster.   Reduce the chances of another bout of acute bronchitis by washing your  hands frequently, avoiding people with cold symptoms, and trying not to touch your hands to your mouth, nose, or eyes.   Keep all follow-up visits as directed by your health care provider.  SEEK MEDICAL CARE IF: Your symptoms do not improve after 1 week of treatment.  SEEK IMMEDIATE MEDICAL CARE IF:  You develop an increased fever or chills.   You have chest pain.   You have severe shortness of breath.  You have bloody sputum.   You develop dehydration.  You faint or repeatedly feel like you are going to pass out.  You develop repeated vomiting.  You develop a severe headache. MAKE SURE YOU:  Understand these instructions.  Will watch your condition.  Will get help right away if you are not doing well or get worse.   This information is not intended to replace advice given to you by your health care provider. Make sure you discuss any questions you have with your health care provider.   Document Released: 01/31/2004 Document Revised: 01/13/2014 Document Reviewed: 06/15/2012 Elsevier Interactive Patient Education [employer].

## 2015-02-05 NOTE — ED Notes (Signed)
Patient started having a cough, nasal congestion, chest congestion, and sore throat symptoms two days ago. Additional symptoms of weakness and sweats are present. Productive cough with yellow colored mucus.

## 2015-02-15 ENCOUNTER — Ambulatory Visit (INDEPENDENT_AMBULATORY_CARE_PROVIDER_SITE_OTHER): Payer: Managed Care, Other (non HMO)

## 2015-02-15 ENCOUNTER — Encounter: Payer: Self-pay | Admitting: Emergency Medicine

## 2015-02-15 ENCOUNTER — Ambulatory Visit
Admission: EM | Admit: 2015-02-15 | Discharge: 2015-02-15 | Disposition: A | Discharge status: Home / Self Care | Payer: Managed Care, Other (non HMO) | Attending: Family Medicine | Admitting: Family Medicine

## 2015-02-15 DIAGNOSIS — J441 Chronic obstructive pulmonary disease with (acute) exacerbation: Secondary | ICD-10-CM | POA: Diagnosis not present

## 2015-02-15 MED ORDER — BUDESONIDE-FORMOTEROL FUMARATE 80-4.5 MCG/ACT IN AERO: 2.0000 | INHALATION_SPRAY | Freq: Two times a day (BID) | RESPIRATORY_TRACT | Status: DC

## 2015-02-15 NOTE — ED Notes (Signed)
Patient c/o ongoing SOB for over a month.  Patient states that she was seen here on 1/5 and diagnosed with COPD.

## 2015-02-15 NOTE — Discharge Instructions (Signed)
You have COPD as discussed. We see evidence of this on Chest X-ray. There is no evidence of Pneumonia. You were already treated well for COPD, however the cough may linger. This is due to COPD. Finish Ceclor last dose. Continue Flonase nasal spray, Albuterol inhaler. Sent new rx for Symbicort inhaler to your pharmacy, cost should be partially covered by insurance, and given you a discount coupon as well.  Recommend establish with new doctor, Dr Vicente Masson here in this building. Future considerations for Pulmonary Function Testing by a lung doctor, also can consider a Low Dose [place] CT Scan to screen for lung cancer.  If significant worsening shortness of breath, cough, fever/chills, or chest pains, please return or go to the Emergency Department for further evaluation.

## 2015-02-15 NOTE — ED Provider Notes (Signed)
CSN: [id number]     Arrival date & time 02/15/15  1243 History   First MD Initiated Contact with Patient 02/15/15 1413     Chief Complaint  Patient presents with  . Shortness of Breath   History provided by patient.  (Consider location/radiation/quality/duration/timing/severity/associated sxs/prior Treatment) HPI   Initial symptoms started with productive cough, shortness of breath, fevers for about 1 week prior to first visit to [place] on 01/11/15, was prescribed Azithromycin Z pak, Tussionex, and Albuterol inhaler (previously did not have any inhalers). 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 Describes "relapse" of symptoms back with cough, shortness of breath, fevers, and then additionally complained of headaches with sinus pressure, ear fullness, also some nausea. Reproted some "wheezing" with "noisy breathing", occasionally worse at night. She returned to [place] on 02/05/15 for re-evaluation. She was diagnosed with COPD exacerbation and bilateral otitis media with effusion, treated with Ipratropium nebulizer with some improvement. Given rx Flonase, trying to use honey, Albuterol inhaler (initially was using it q 4-6 hours for a few days with improvement, then now using PRN about twice daily), Prednisone 50mg  daily x 5 days, also given Ceclor 500mg  BID x 10 day course. - Sick contact at work with sinusitis, and other relatives got similar URI.  Today she reports feeling gradually better but still persistent chest congestion and cough (occasionally still productive) but less shortness of breath and afebrile. She finished prednisone (50mg  x 5 days, she was concerned that it did induce a hypomanic episode given her history of bipolar and advised by her psychiatrist to avoid it in future), now using Albuterol PRN x 2 daily with relief, still taking tussionex, on Day 9 out of 10 of Ceclor. Additionally, resolved headaches and sinus pressure. She is concerned about  COPD and requesting chest x-ray. No formal diagnosis COPD. No longer established with Pulmonology >5 years ago [facility] [place]), has not been back, unable to recall if ever has had spirometry   Former smoker quit 15 years ago, chronic history prior with heavy smoking up to 3 packs a day  Past Medical History  Diagnosis Date  . Depression   . Sleep disorder   . Arthritis   . Fibroadenoma    Past Surgical History  Procedure Laterality Date  . Cesarean section     History reviewed. No pertinent family history. Social History  Substance Use Topics  . Smoking status: Former Research scientist (life sciences)  . Smokeless tobacco: None  . Alcohol Use: Yes   OB History    No data available     Review of Systems  See above HPI  Denies any fevers/chills, sweating, headache, sinus pressure, purulent nasal discharge, chest pain or pressure, abdominal pain, nausea, vomiting, lightheadedness, dizziness, syncope  Allergies  Review of patient's allergies indicates no known allergies.  Home Medications   Prior to Admission medications   Medication Sig Start Date End Date Taking? Authorizing Provider  albuterol (PROVENTIL HFA;VENTOLIN HFA) 108 (90 Base) MCG/ACT inhaler Inhale 2 puffs into the lungs every 4 (four) hours as needed for wheezing or shortness of breath. 01/11/15   Frederich Cha, MD  amitriptyline (ELAVIL) 75 MG tablet Take 75 mg by mouth at bedtime.    Historical Provider, MD  budesonide-formoterol (SYMBICORT) 80-4.5 MCG/ACT inhaler Inhale 2 puffs into the lungs 2 (two) times daily. 02/15/15   Olin Hauser, DO  cefaclor (CECLOR) 500 MG capsule Take 1 capsule (500 mg total) by mouth 2 (two) times daily. For  10 days 02/07/15 02/17/15  Olen Cordial, NP  celecoxib (CELEBREX) 200 MG capsule Take 200 mg by mouth 2 (two) times daily.    Historical Provider, MD  chlorpheniramine-HYDROcodone (TUSSIONEX PENNKINETIC ER) 10-8 MG/5ML SUER Take 5 mLs by mouth at bedtime as needed for cough. 02/05/15   Olen Cordial, NP  fluticasone (FLONASE) 50 MCG/ACT nasal spray Place 1 spray into both nostrils 2 (two) times daily. 02/05/15   Olen Cordial, NP  lamoTRIgine (LAMICTAL) 100 MG tablet Take 100 mg by mouth daily.    Historical Provider, MD  loratadine (CLARITIN) 10 MG tablet Take 1 tablet (10 mg total) by mouth daily. 02/05/15   Olen Cordial, NP  Milnacipran HCl (SAVELLA) 100 MG TABS tablet Take 100 mg by mouth 2 (two) times daily.    Historical Provider, MD  sodium chloride (OCEAN) 0.65 % SOLN nasal spray Place 2 sprays into both nostrils every 2 (two) hours while awake. 02/05/15   Olen Cordial, NP  traZODone (DESYREL) 100 MG tablet Take 100 mg by mouth at bedtime.    Historical Provider, MD  Vortioxetine HBr (TRINTELLIX) 20 MG TABS Take 20 mg by mouth daily.    Historical Provider, MD   Meds Ordered and Administered this Visit  Medications - No data to display  BP 123/72 mmHg  Pulse 93  Temp(Src) 98.7 F (37.1 C) (Tympanic)  Resp 16  Ht 5\' 4"  (1.626 m)  Wt 150 lb (68.04 kg)  BMI 25.73 kg/m2  SpO2 98%  LMP  No data found.   Physical Exam  Constitutional: She is oriented to person, place, and time. She appears well-developed and well-nourished. No distress.  60 yr Female, sitting up comfortably, wearing mask, conversational with infrequent coughing  HENT:  Head: Normocephalic and atraumatic.  Mouth/Throat: Oropharynx is clear and moist.  Sinuses non-tender. Bilateral TM's grey and clear without effusion or erythema. No purulent nasal drainage. Oropharynx clear without erythema, no post-nasal drip  Eyes: Conjunctivae and EOM are normal. Pupils are equal, round, and reactive to light. Right eye exhibits no discharge. Left eye exhibits no discharge.  Neck: Normal range of motion. Neck supple. No thyromegaly present.  Cardiovascular: Normal rate, regular rhythm, normal heart sounds and intact distal pulses.   No murmur heard. Pulmonary/Chest: Effort normal and breath sounds  normal. No respiratory distress. She has no wheezes. She has no rales. She exhibits no tenderness.  Mild reduced air movement bilateral bases, good resp effort. Speaks full sentences.  Abdominal: Soft.  Musculoskeletal: Normal range of motion. She exhibits no edema.  Lymphadenopathy:    She has no cervical adenopathy.  Neurological: She is alert and oriented to person, place, and time.  Skin: Skin is warm and dry. No rash noted. She is not diaphoretic.  Psychiatric: She has a normal mood and affect. Her behavior is normal. Thought content normal.  Nursing note and vitals reviewed.   ED Course  Procedures (including critical care time)  Labs Review Labs Reviewed - No data to display  Imaging Review Dg Chest 2 View  02/15/2015  CLINICAL DATA:  Progressive shortness of breath.  COPD. EXAM: CHEST  2 VIEW COMPARISON:  None. FINDINGS: Tapering of the peripheral pulmonary vasculature favors emphysema. Cardiac and mediastinal margins appear normal. No current airway thickening or airspace opacity. No pleural effusion. Mild thoracic spondylosis. IMPRESSION: 1. Suspected emphysema. 2. Mild thoracic spondylosis. Electronically Signed   By: Van Clines M.D.   On: 02/15/2015 15:12  MDM   1. COPD exacerbation ([place])    60 yr Female with PMH long history of chronic tobacco abuse (3ppd x 30 yr) but former smoker (quit 15 yr ago), bipolar 2 disorder, presents with 3rd visit to UC for suspected COPD exacerbation with productive cough, shortness of breath. Prior visits 01/11/15 and 02/05/15, has completed Azithormycin Zpak and Ceclor 500 BID x 10 days (finished 9/10 days), also completed Prednisone 50 x 5 days, using Albuterol with improvement. Clinically well-appearing today with lingering mild productive cough, no respiratory distress, stable vitals with 98% on RA, no wheezing on exam only mild diminished basilar air movement. Overall she has significantly improved but still lingering cough with some  SOB.  Suspected recurrent COPD exacerbation, now improving on recent steroids / antibiotics. No evidence of worsening COPD exac today. Differential includes CAP, however already treated with antibiotic courses, and afebrile. Unlikely PE with productive cough and no tachycardia. Proceed with Chest X-ray.  UPDATE 1520 Reviewed results of CXR without acute infiltrate, appearance of COPD with emphysema. Advised patient to finish last dose of Ceclor, continue Albuterol inhaler, Flonase, no more prednisone at this time since prior hypomania on this. Discussed that cough with COPD may linger for few weeks as she continues to improve. Start maintenance inhaler therapy with Symbicort (contacted her pharmacy, reviewed several options difficulty with coverage per ins), given Symbicort coupon discount card, advised it would be $60. Given follow-up info for Dr Vicente Masson to establish as PCP, consider future PFTs, Low dose Chest CT for lung CA screening in future given long smoking history.  Olin Hauser, DO 02/15/15 1524

## 2015-02-15 NOTE — ED Provider Notes (Addendum)
CSN: [id number]     Arrival date & time 02/15/15  1243 History   First MD Initiated Contact with Patient 02/15/15 1413   Nurses notes were reviewed.  Chief Complaint  Patient presents with  . Shortness of Breath   Patient's here because of shortness of breath and fatigue. She is worried about pneumonia she also percent sometimes fatigue. She wants to have a chest x-ray is worried about cancer she's had multiple episodes of bronchospasms and shortness of breath in the last several weeks. This is the third visit to this facility because of her shortness of breath and symptoms of aspiration COPD. Patient was seen w/Dr Parks Ranger (Dr. Raliegh Ip) Please refer to Dr. Vicente Masson note for further information and further details on this patient's visit.  History of sleep disorder fibroadenoma she is a former smoker but no pertinent family medical history for this visit.        (Consider location/radiation/quality/duration/timing/severity/associated sxs/prior Treatment) HPI  Past Medical History  Diagnosis Date  . Depression   . Sleep disorder   . Arthritis   . Fibroadenoma    Past Surgical History  Procedure Laterality Date  . Cesarean section     History reviewed. No pertinent family history. Social History  Substance Use Topics  . Smoking status: Former Research scientist (life sciences)  . Smokeless tobacco: None  . Alcohol Use: Yes   OB History    No data available     Review of Systems  Allergies  Review of patient's allergies indicates no known allergies.  Home Medications   Prior to Admission medications   Medication Sig Start Date End Date Taking? Authorizing Provider  albuterol (PROVENTIL HFA;VENTOLIN HFA) 108 (90 Base) MCG/ACT inhaler Inhale 2 puffs into the lungs every 4 (four) hours as needed for wheezing or shortness of breath. 01/11/15   Frederich Cha, MD  amitriptyline (ELAVIL) 75 MG tablet Take 75 mg by mouth at bedtime.    Historical Provider, MD  budesonide-formoterol (SYMBICORT) 80-4.5 MCG/ACT  inhaler Inhale 2 puffs into the lungs 2 (two) times daily. 02/15/15   Olin Hauser, DO  cefaclor (CECLOR) 500 MG capsule Take 1 capsule (500 mg total) by mouth 2 (two) times daily. For 10 days 02/07/15 02/17/15  Olen Cordial, NP  celecoxib (CELEBREX) 200 MG capsule Take 200 mg by mouth 2 (two) times daily.    Historical Provider, MD  chlorpheniramine-HYDROcodone (TUSSIONEX PENNKINETIC ER) 10-8 MG/5ML SUER Take 5 mLs by mouth at bedtime as needed for cough. 02/05/15   Olen Cordial, NP  fluticasone (FLONASE) 50 MCG/ACT nasal spray Place 1 spray into both nostrils 2 (two) times daily. 02/05/15   Olen Cordial, NP  lamoTRIgine (LAMICTAL) 100 MG tablet Take 100 mg by mouth daily.    Historical Provider, MD  loratadine (CLARITIN) 10 MG tablet Take 1 tablet (10 mg total) by mouth daily. 02/05/15   Olen Cordial, NP  Milnacipran HCl (SAVELLA) 100 MG TABS tablet Take 100 mg by mouth 2 (two) times daily.    Historical Provider, MD  sodium chloride (OCEAN) 0.65 % SOLN nasal spray Place 2 sprays into both nostrils every 2 (two) hours while awake. 02/05/15   Olen Cordial, NP  traZODone (DESYREL) 100 MG tablet Take 100 mg by mouth at bedtime.    Historical Provider, MD  Vortioxetine HBr (TRINTELLIX) 20 MG TABS Take 20 mg by mouth daily.    Historical Provider, MD   Meds Ordered and Administered this Visit  Medications - No data to display  BP 123/72 mmHg  Pulse 93  Temp(Src) 98.7 F (37.1 C) (Tympanic)  Resp 16  Ht 5\' 4"  (1.626 m)  Wt 150 lb (68.04 kg)  BMI 25.73 kg/m2  SpO2 98%  LMP  No data found.   Physical Exam  Constitutional: She is oriented to person, place, and time. She appears well-developed and well-nourished. No distress.  Eyes: Conjunctivae are normal.  Neck: Neck supple.  Pulmonary/Chest: No respiratory distress. She has wheezes.  Musculoskeletal: Normal range of motion.  Neurological: She is alert and oriented to person, place, and time.  Skin: Skin is  warm and dry.  Psychiatric: She has a normal mood and affect.  Vitals reviewed.   ED Course  Procedures (including critical care time)  Labs Review Labs Reviewed - No data to display  Imaging Review Dg Chest 2 View  02/15/2015  CLINICAL DATA:  Progressive shortness of breath.  COPD. EXAM: CHEST  2 VIEW COMPARISON:  None. FINDINGS: Tapering of the peripheral pulmonary vasculature favors emphysema. Cardiac and mediastinal margins appear normal. No current airway thickening or airspace opacity. No pleural effusion. Mild thoracic spondylosis. IMPRESSION: 1. Suspected emphysema. 2. Mild thoracic spondylosis. Electronically Signed   By: Van Clines M.D.   On: 02/15/2015 15:12     Visual Acuity Review  Right Eye Distance:   Left Eye Distance:   Bilateral Distance:    Right Eye Near:   Left Eye Near:    Bilateral Near:         MDM   1. COPD exacerbation ([place])    We talked recommend referral for PCP. Also because of her concern aboutcancer of the lungs recommend a low yield CT of the chest ordered by her PCP when she established. Because fatigue that continues probably needed with also recommended Dr. Raliegh Ip and I that she finishes Ceclor we'll pursue more prednisone at this time and continue to have her use of Tussionex and renew cough medicine needed. Follow-up with PCP. Chest x-ray confirms emphysema and no signs of any nodules or masses on the plain chest x-ray    Frederich Cha, MD 02/15/15 [id number]  Frederich Cha, MD 02/15/15 2024

## 2015-03-07 ENCOUNTER — Ambulatory Visit (INDEPENDENT_AMBULATORY_CARE_PROVIDER_SITE_OTHER): Payer: Managed Care, Other (non HMO) | Admitting: Family Medicine

## 2015-03-07 ENCOUNTER — Other Ambulatory Visit: Payer: Self-pay

## 2015-03-07 ENCOUNTER — Encounter: Payer: Self-pay | Admitting: Family Medicine

## 2015-03-07 VITALS — BP 110/78 | HR 75 | Temp 98.4°F | Resp 16 | Ht 64.0 in | Wt 149.2 lb

## 2015-03-07 DIAGNOSIS — F3181 Bipolar II disorder: Secondary | ICD-10-CM

## 2015-03-07 DIAGNOSIS — N941 Unspecified dyspareunia: Secondary | ICD-10-CM | POA: Diagnosis not present

## 2015-03-07 DIAGNOSIS — E559 Vitamin D deficiency, unspecified: Secondary | ICD-10-CM | POA: Diagnosis not present

## 2015-03-07 DIAGNOSIS — Z1239 Encounter for other screening for malignant neoplasm of breast: Secondary | ICD-10-CM

## 2015-03-07 DIAGNOSIS — J439 Emphysema, unspecified: Secondary | ICD-10-CM

## 2015-03-07 DIAGNOSIS — R5382 Chronic fatigue, unspecified: Secondary | ICD-10-CM

## 2015-03-07 DIAGNOSIS — M159 Polyosteoarthritis, unspecified: Secondary | ICD-10-CM

## 2015-03-07 DIAGNOSIS — M150 Primary generalized (osteo)arthritis: Secondary | ICD-10-CM

## 2015-03-07 DIAGNOSIS — J309 Allergic rhinitis, unspecified: Secondary | ICD-10-CM

## 2015-03-07 DIAGNOSIS — M797 Fibromyalgia: Secondary | ICD-10-CM

## 2015-03-07 DIAGNOSIS — Z23 Encounter for immunization: Secondary | ICD-10-CM

## 2015-03-07 DIAGNOSIS — M1990 Unspecified osteoarthritis, unspecified site: Secondary | ICD-10-CM | POA: Insufficient documentation

## 2015-03-07 DIAGNOSIS — R5383 Other fatigue: Secondary | ICD-10-CM | POA: Insufficient documentation

## 2015-03-07 MED ORDER — FLUTICASONE PROPIONATE 50 MCG/ACT NA SUSP: 1.0000 | Freq: Two times a day (BID) | NASAL | Status: DC | PRN

## 2015-03-07 MED ORDER — SALINE SPRAY 0.65 % NA SOLN: 2.0000 | NASAL | Status: DC | PRN

## 2015-03-07 MED ORDER — LORATADINE 10 MG PO TABS: 10.0000 mg | ORAL_TABLET | Freq: Every day | ORAL | Status: DC | PRN

## 2015-03-07 NOTE — Patient Instructions (Signed)
Stop trazodone, continue amitriptyline.

## 2015-03-07 NOTE — Progress Notes (Signed)
Date:  03/07/2015   Name:  Christina Hurst   DOB:  09/26/1955   MRN:  [id number]  PCP:  No primary care provider on file.    Chief Complaint: Establish Care and COPD   History of Present Illness:  This is a 60 y.o. female to establish care, has been seen [place] three times in past two months for bronchitis, CXR 02/15/15 showed suspected emphysema. Heavy smoker until quit years ago. Placed on Symbicort and albuterol (taking both 2 puffs bid) which seem to help. No hx PFT's or pulmonary eval. Hx fibromyalgia on Savella/Elavil, PMD in [place] used to rx with regular Solumedrol and Toradol shots. Saw neuro in October, recommended Pamelor but never took. C/o chronic fatigue, worse past few months. Hx bipolar 2 d/o followed by [facility] on Trintellix/Lamictal, also takes Tranxene/prn Ativan for anxiety and trazodone for sleep. Hx OA on Celebrex bid for several years, has not tried to taper. Hx dyspreunia followed by [place] Smoker on [place] and Provera. UTI in 2022/02/02. Father died hep C, mother with OA/dementia, sister with RA. Colonoscopy age 66 ok, last mammo 3-4 yrs ago, Pap/pelvic last year, tetanus status unknown. No recent blood work.  Review of Systems:  Review of Systems  Constitutional: Negative for fever and unexpected weight change.  Cardiovascular: Negative for chest pain, palpitations and leg swelling.  Gastrointestinal: Negative for abdominal pain.  Endocrine: Negative for polyuria.  Genitourinary: Negative for difficulty urinating.  Neurological: Negative for tremors, syncope and light-headedness.    Patient Active Problem List   Diagnosis Date Noted  . Dyspareunia in female 03/07/2015  . Bipolar 2 disorder ([place]) 03/07/2015  . Emphysema of lung ([place]) 03/07/2015  . Fatigue 03/07/2015  . Osteoarthritis 03/07/2015  . Fibromyalgia 10/09/2014    Prior to Admission medications   Medication Sig Start Date End Date Taking? Authorizing Provider  albuterol (PROVENTIL HFA;VENTOLIN HFA) 108 (90  Base) MCG/ACT inhaler Inhale 2 puffs into the lungs every 4 (four) hours as needed for wheezing or shortness of breath. 01/11/15  Yes Frederich Cha, MD  amitriptyline (ELAVIL) 75 MG tablet Take 75 mg by mouth at bedtime.   Yes Historical Provider, MD  budesonide-formoterol (SYMBICORT) 80-4.5 MCG/ACT inhaler Inhale 2 puffs into the lungs 2 (two) times daily. 02/15/15  Yes Olin Hauser, DO  celecoxib (CELEBREX) 200 MG capsule Take 200 mg by mouth 2 (two) times daily.   Yes Historical Provider, MD  clorazepate (TRANXENE) 7.5 MG tablet Take 1 tablet by mouth daily. 02/23/15  Yes Historical Provider, MD  fluticasone (FLONASE) 50 MCG/ACT nasal spray Place 1 spray into both nostrils 2 (two) times daily as needed for allergies or rhinitis. 03/07/15  Yes Adline Potter, MD  lamoTRIgine (LAMICTAL) 100 MG tablet Take 100 mg by mouth daily.   Yes Historical Provider, MD  loratadine (CLARITIN) 10 MG tablet Take 1 tablet (10 mg total) by mouth daily as needed for allergies. 03/07/15  Yes Adline Potter, MD  LORazepam (ATIVAN) 1 MG tablet Take 1 tablet by mouth every 6 (six) hours as needed. 02/21/15  Yes Historical Provider, MD  medroxyPROGESTERone (PROVERA) 2.5 MG tablet Take 1 tablet by mouth daily. 10/12/13  Yes Historical Provider, MD  Milnacipran HCl (SAVELLA) 100 MG TABS tablet Take 100 mg by mouth 2 (two) times daily.   Yes Historical Provider, MD  Ospemifene (OSPHENA) 60 MG TABS Take 1 tablet by mouth daily. 03/08/14  Yes Historical Provider, MD  sodium chloride (OCEAN) 0.65 % SOLN nasal spray Place 2 sprays into  both nostrils as needed for congestion. 03/07/15  Yes Adline Potter, MD  Vortioxetine HBr (TRINTELLIX) 20 MG TABS Take 20 mg by mouth daily.   Yes Historical Provider, MD    Allergies  Allergen Reactions  . Codeine Itching and Nausea Only    Past Surgical History  Procedure Laterality Date  . Cesarean section      Social History  Substance Use Topics  . Smoking status: Former Smoker    Quit  date: 03/07/1995  . Smokeless tobacco: Never Used  . Alcohol Use: 1.8 oz/week    3 Cans of beer per week    Family History  Problem Relation Age of Onset  . Depression Mother   . Arthritis Mother   . Hepatitis C Father   . Cancer Maternal Grandmother     Medication list has been reviewed and updated.  Physical Examination: BP 110/78 mmHg  Pulse 75  Temp(Src) 98.4 F (36.9 C) (Oral)  Resp 16  Ht 5\' 4"  (1.626 m)  Wt 149 lb 3.2 oz (67.677 kg)  BMI 25.60 kg/m2  SpO2 95%  Physical Exam  Constitutional: She is oriented to person, place, and time. She appears well-developed and well-nourished.  HENT:  Head: Normocephalic and atraumatic.  Right Ear: External ear normal.  Left Ear: External ear normal.  Nose: Nose normal.  Mouth/Throat: Oropharynx is clear and moist.  TM's clear  Eyes: Conjunctivae and EOM are normal. Pupils are equal, round, and reactive to light.  Neck: Neck supple. No thyromegaly present.  Cardiovascular: Normal rate, regular rhythm and normal heart sounds.   Pulmonary/Chest: Effort normal and breath sounds normal.  Abdominal: Soft. She exhibits no distension and no mass. There is no tenderness.  Musculoskeletal: She exhibits no edema.  Lymphadenopathy:    She has no cervical adenopathy.  Neurological: She is alert and oriented to person, place, and time. Coordination normal.  Skin: Skin is warm and dry.  Psychiatric: She has a normal mood and affect. Her behavior is normal.  Nursing note and vitals reviewed.   Assessment and Plan:  1. Chronic fatigue Unclear etiology, may be due to multiple sedating meds, d/c trazodone, cont Elavil for now, check labs - TSH - Comprehensive Metabolic Panel (CMET) - CBC  2. Pulmonary emphysema, unspecified emphysema type ([place]) Marginal sx control on Symbicort/albuterol (advised using albuterol prn only) - Ambulatory referral to Pulmonology  3. Dyspareunia in female On [place]/Provera, followed by GYN  4.  Bipolar 2 disorder ([place]) On Trintellix/Lamictal/Tranxene/Ativan prn, followed by [facility]  5. Primary osteoarthritis involving multiple joints On Celebrex bid, consider taper next visit  6. Fibromyalgia On Savella/Elavil, consider change Elavil to Pamelor next visit per neuro, explained we cannot give routine Solumedrol/Toradol injections - Vitamin D (25 hydroxy) - B12  7. Breast cancer screening - MM Digital Screening; Future  8. Need for tetanus booster - Tdap vaccine greater than or equal to 7yo IM  9. AR On Claritin/Flonase/saline NS prn  Return in about 4 weeks (around 04/04/2015).  Satira Anis. [facility]  03/07/2015

## 2015-03-08 DIAGNOSIS — E559 Vitamin D deficiency, unspecified: Secondary | ICD-10-CM | POA: Insufficient documentation

## 2015-03-08 LAB — CBC
HEMOGLOBIN: 12 g/dL (ref 11.1–15.9)
Hematocrit: 36.3 % (ref 34.0–46.6)
MCH: 28.6 pg (ref 26.6–33.0)
MCHC: 33.1 g/dL (ref 31.5–35.7)
MCV: 87 fL (ref 79–97)
Platelets: 256 10*3/uL (ref 150–379)
RBC: 4.19 x10E6/uL (ref 3.77–5.28)
RDW: 13.4 % (ref 12.3–15.4)
WBC: 7.8 10*3/uL (ref 3.4–10.8)

## 2015-03-08 LAB — COMPREHENSIVE METABOLIC PANEL
A/G RATIO: 1.8 (ref 1.1–2.5)
ALT: 17 IU/L (ref 0–32)
AST: 16 IU/L (ref 0–40)
Albumin: 4.4 g/dL (ref 3.5–5.5)
Alkaline Phosphatase: 90 IU/L (ref 39–117)
BUN/Creatinine Ratio: 18 (ref 9–23)
BUN: 18 mg/dL (ref 6–24)
Bilirubin Total: <0.2 mg/dL (ref 0.0–1.2)
CALCIUM: 9.2 mg/dL (ref 8.7–10.2)
CHLORIDE: 103 mmol/L (ref 96–106)
CO2: 23 mmol/L (ref 18–29)
Creatinine, Ser: 1.01 mg/dL — ABNORMAL HIGH (ref 0.57–1.00)
GFR calc Af Amer: 70 mL/min/{1.73_m2} (ref >59)
GFR, EST NON AFRICAN AMERICAN: 61 mL/min/{1.73_m2} (ref >59)
Globulin, Total: 2.4 g/dL (ref 1.5–4.5)
Glucose: 97 mg/dL (ref 65–99)
POTASSIUM: 4.4 mmol/L (ref 3.5–5.2)
Sodium: 145 mmol/L — ABNORMAL HIGH (ref 134–144)
Total Protein: 6.8 g/dL (ref 6.0–8.5)

## 2015-03-08 LAB — TSH: TSH: 0.809 u[IU]/mL (ref 0.450–4.500)

## 2015-03-08 LAB — VITAMIN D 25 HYDROXY (VIT D DEFICIENCY, FRACTURES): VIT D 25 HYDROXY: 24.3 ng/mL — AB (ref 30.0–100.0)

## 2015-03-08 LAB — VITAMIN B12: VITAMIN B 12: 504 pg/mL (ref 211–946)

## 2015-03-08 MED ORDER — VITAMIN D 50 MCG (2000 UT) PO CAPS: 1.0000 | ORAL_CAPSULE | Freq: Every day | ORAL | Status: DC

## 2015-03-08 NOTE — Addendum Note (Signed)
Addended by: Adline Potter on: 03/08/2015 10:23 AM   Modules accepted: Orders, SmartSet

## 2015-04-04 ENCOUNTER — Ambulatory Visit: Payer: Managed Care, Other (non HMO) | Admitting: Family Medicine

## 2015-04-27 ENCOUNTER — Ambulatory Visit: Payer: Managed Care, Other (non HMO) | Admitting: Family Medicine

## 2015-05-09 DIAGNOSIS — J449 Chronic obstructive pulmonary disease, unspecified: Secondary | ICD-10-CM | POA: Insufficient documentation

## 2015-05-13 ENCOUNTER — Encounter: Payer: Self-pay | Admitting: Gynecology

## 2015-05-13 ENCOUNTER — Ambulatory Visit
Admission: EM | Admit: 2015-05-13 | Discharge: 2015-05-13 | Disposition: A | Discharge status: Home / Self Care | Payer: Managed Care, Other (non HMO) | Attending: Family Medicine | Admitting: Family Medicine

## 2015-05-13 DIAGNOSIS — J439 Emphysema, unspecified: Secondary | ICD-10-CM

## 2015-05-13 DIAGNOSIS — L309 Dermatitis, unspecified: Secondary | ICD-10-CM

## 2015-05-13 MED ORDER — HYDROCOD POLST-CPM POLST ER 10-8 MG/5ML PO SUER: 5.0000 mL | Freq: Two times a day (BID) | ORAL | Status: DC

## 2015-05-13 MED ORDER — TRIAMCINOLONE ACETONIDE 0.1 % EX CREA: 1.0000 "application " | TOPICAL_CREAM | Freq: Two times a day (BID) | CUTANEOUS | Status: DC

## 2015-05-13 NOTE — ED Notes (Signed)
Patient stated notice red spot on upper extremities of her body. Patient also would like cough medicine for her cough.

## 2015-05-13 NOTE — Discharge Instructions (Signed)
Chronic Obstructive Pulmonary Disease Chronic obstructive pulmonary disease (COPD) is a common lung condition in which airflow from the lungs is limited. COPD is a general term that can be used to describe many different lung problems that limit airflow, including both chronic bronchitis and emphysema. If you have COPD, your lung function will probably never return to normal, but there are measures you can take to improve lung function and make yourself feel better. CAUSES   Smoking (common).  Exposure to secondhand smoke.  Genetic problems.  Chronic inflammatory lung diseases or recurrent infections. SYMPTOMS  Shortness of breath, especially with physical activity.  Deep, persistent (chronic) cough with a large amount of thick mucus.  Wheezing.  Rapid breaths (tachypnea).  Gray or bluish discoloration (cyanosis) of the skin, especially in your fingers, toes, or lips.  Fatigue.  Weight loss.  Frequent infections or episodes when breathing symptoms become much worse (exacerbations).  Chest tightness. DIAGNOSIS Your health care provider will take a medical history and perform a physical examination to diagnose COPD. Additional tests for COPD may include:  Lung (pulmonary) function tests.  Chest X-ray.  CT scan.  Blood tests. TREATMENT  Treatment for COPD may include:  Inhaler and nebulizer medicines. These help manage the symptoms of COPD and make your breathing more comfortable.  Supplemental oxygen. Supplemental oxygen is only helpful if you have a low oxygen level in your blood.  Exercise and physical activity. These are beneficial for nearly all people with COPD.  Lung surgery or transplant.  Nutrition therapy to gain weight, if you are underweight.  Pulmonary rehabilitation. This may involve working with a team of health care providers and specialists, such as respiratory, occupational, and physical therapists. HOME CARE INSTRUCTIONS  Take all medicines  (inhaled or pills) as directed by your health care provider.  Avoid over-the-counter medicines or cough syrups that dry up your airway (such as antihistamines) and slow down the elimination of secretions unless instructed otherwise by your health care provider.  If you are a smoker, the most important thing that you can do is stop smoking. Continuing to smoke will cause further lung damage and breathing trouble. Ask your health care provider for help with quitting smoking. He or she can direct you to community resources or hospitals that provide support.  Avoid exposure to irritants such as smoke, chemicals, and fumes that aggravate your breathing.  Use oxygen therapy and pulmonary rehabilitation if directed by your health care provider. If you require home oxygen therapy, ask your health care provider whether you should purchase a pulse oximeter to measure your oxygen level at home.  Avoid contact with individuals who have a contagious illness.  Avoid extreme temperature and humidity changes.  Eat healthy foods. Eating smaller, more frequent meals and resting before meals may help you maintain your strength.  Stay active, but balance activity with periods of rest. Exercise and physical activity will help you maintain your ability to do things you want to do.  Preventing infection and hospitalization is very important when you have COPD. Make sure to receive all the vaccines your health care provider recommends, especially the pneumococcal and influenza vaccines. Ask your health care provider whether you need a pneumonia vaccine.  Learn and use relaxation techniques to manage stress.  Learn and use controlled breathing techniques as directed by your health care provider. Controlled breathing techniques include:  Pursed lip breathing. Start by breathing in (inhaling) through your nose for 1 second. Then, purse your lips as if you were  going to whistle and breathe out (exhale) through the  pursed lips for 2 seconds.  Diaphragmatic breathing. Start by putting one hand on your abdomen just above your waist. Inhale slowly through your nose. The hand on your abdomen should move out. Then purse your lips and exhale slowly. You should be able to feel the hand on your abdomen moving in as you exhale.  Learn and use controlled coughing to clear mucus from your lungs. Controlled coughing is a series of short, progressive coughs. The steps of controlled coughing are: 1. Lean your head slightly forward. 2. Breathe in deeply using diaphragmatic breathing. 3. Try to hold your breath for 3 seconds. 4. Keep your mouth slightly open while coughing twice. 5. Spit any mucus out into a tissue. 6. Rest and repeat the steps once or twice as needed. SEEK MEDICAL CARE IF:  You are coughing up more mucus than usual.  There is a change in the color or thickness of your mucus.  Your breathing is more labored than usual.  Your breathing is faster than usual. SEEK IMMEDIATE MEDICAL CARE IF:  You have shortness of breath while you are resting.  You have shortness of breath that prevents you from:  Being able to talk.  Performing your usual physical activities.  You have chest pain lasting longer than 5 minutes.  Your skin color is more cyanotic than usual.  You measure low oxygen saturations for longer than 5 minutes with a pulse oximeter. MAKE SURE YOU:  Understand these instructions.  Will watch your condition.  Will get help right away if you are not doing well or get worse.   This information is not intended to replace advice given to you by your health care provider. Make sure you discuss any questions you have with your health care provider.   Document Released: 10/02/2004 Document Revised: 01/13/2014 Document Reviewed: 08/19/2012 Elsevier Interactive Patient Education 2016 [employer]  Rash A rash is a change in the color or texture of the skin. There are many different  types of rashes. You may have other problems that accompany your rash. CAUSES   Infections.  Allergic reactions. This can include allergies to pets or foods.  Certain medicines.  Exposure to certain chemicals, soaps, or cosmetics.  Heat.  Exposure to poisonous plants.  Tumors, both cancerous and noncancerous. SYMPTOMS   Redness.  Scaly skin.  Itchy skin.  Dry or cracked skin.  Bumps.  Blisters.  Pain. DIAGNOSIS  Your caregiver may do a physical exam to determine what type of rash you have. A skin sample (biopsy) may be taken and examined under a microscope. TREATMENT  Treatment depends on the type of rash you have. Your caregiver may prescribe certain medicines. For serious conditions, you may need to see a skin doctor (dermatologist). HOME CARE INSTRUCTIONS   Avoid the substance that caused your rash.  Do not scratch your rash. This can cause infection.  You may take cool baths to help stop itching.  Only take over-the-counter or prescription medicines as directed by your caregiver.  Keep all follow-up appointments as directed by your caregiver. SEEK IMMEDIATE MEDICAL CARE IF:  You have increasing pain, swelling, or redness.  You have a fever.  You have new or severe symptoms.  You have body aches, diarrhea, or vomiting.  Your rash is not better after 3 days. MAKE SURE YOU:  Understand these instructions.  Will watch your condition.  Will get help right away if you are not doing well  or get worse.   This information is not intended to replace advice given to you by your health care provider. Make sure you discuss any questions you have with your health care provider.   Document Released: 12/13/2001 Document Revised: 01/13/2014 Document Reviewed: 05/10/2014 Elsevier Interactive Patient Education [employer].

## 2015-05-13 NOTE — ED Provider Notes (Signed)
CSN: [id number]     Arrival date & time 05/13/15  1531 History   First MD Initiated Contact with Patient 05/13/15 1700     Chief Complaint  Patient presents with  . Cough  . Rash   (Consider location/radiation/quality/duration/timing/severity/associated sxs/prior Treatment) HPI   60 year old female who presents with red spots on her torso anteriorly but not on her back that affects the groin and her breasts. It is not on her face arms hands or legs. States that she has a small dog at home but does not believe that they have fleas. He states that the areas of rash are not itchy. In addition the patient has a bad cough from emphysema was recently evaluated by Dr. Raul Del and placed on inhalers. She states that the only thing that stops the coughing in the past has been the cough syrup. I researched the [place] substance abuse site and she had 2 prescriptions in January but none since then. States that the inhalers that Dr. Raul Del has placed her on has helped her quite a bit.  Past Medical History  Diagnosis Date  . Depression   . Sleep disorder   . Arthritis   . Fibroadenoma    Past Surgical History  Procedure Laterality Date  . Cesarean section     Family History  Problem Relation Age of Onset  . Depression Mother   . Arthritis Mother   . Hepatitis C Father   . Cancer Maternal Grandmother    Social History  Substance Use Topics  . Smoking status: Former Smoker    Quit date: 03/07/1995  . Smokeless tobacco: Never Used  . Alcohol Use: 1.8 oz/week    3 Cans of beer per week   OB History    No data available     Review of Systems  Constitutional: Positive for activity change. Negative for fever, chills and fatigue.  Respiratory: Positive for cough. Negative for shortness of breath, wheezing and stridor.   Skin: Positive for rash.  All other systems reviewed and are negative.   Allergies  Codeine  Home Medications   Prior to Admission medications   Medication  Sig Start Date End Date Taking? Authorizing Provider  albuterol (PROVENTIL HFA;VENTOLIN HFA) 108 (90 Base) MCG/ACT inhaler Inhale 2 puffs into the lungs every 4 (four) hours as needed for wheezing or shortness of breath. 01/11/15  Yes Frederich Cha, MD  amitriptyline (ELAVIL) 75 MG tablet Take 75 mg by mouth at bedtime.   Yes Historical Provider, MD  budesonide-formoterol (SYMBICORT) 80-4.5 MCG/ACT inhaler Inhale 2 puffs into the lungs 2 (two) times daily. 02/15/15  Yes Olin Hauser, DO  celecoxib (CELEBREX) 200 MG capsule Take 200 mg by mouth 2 (two) times daily.   Yes Historical Provider, MD  Cholecalciferol (VITAMIN D) 2000 units CAPS Take 1 capsule (2,000 Units total) by mouth daily. 03/08/15  Yes Adline Potter, MD  clorazepate (TRANXENE) 7.5 MG tablet Take 1 tablet by mouth daily. 02/23/15  Yes Historical Provider, MD  fluticasone (FLONASE) 50 MCG/ACT nasal spray Place 1 spray into both nostrils 2 (two) times daily as needed for allergies or rhinitis. 03/07/15  Yes Adline Potter, MD  lamoTRIgine (LAMICTAL) 100 MG tablet Take 100 mg by mouth daily.   Yes Historical Provider, MD  loratadine (CLARITIN) 10 MG tablet Take 1 tablet (10 mg total) by mouth daily as needed for allergies. 03/07/15  Yes Adline Potter, MD  LORazepam (ATIVAN) 1 MG tablet Take 1 tablet by mouth every 6 (six)  hours as needed. 02/21/15  Yes Historical Provider, MD  medroxyPROGESTERone (PROVERA) 2.5 MG tablet Take 1 tablet by mouth daily. 10/12/13  Yes Historical Provider, MD  Milnacipran HCl (SAVELLA) 100 MG TABS tablet Take 100 mg by mouth 2 (two) times daily.   Yes Historical Provider, MD  Ospemifene (OSPHENA) 60 MG TABS Take 1 tablet by mouth daily. 03/08/14  Yes Historical Provider, MD  sodium chloride (OCEAN) 0.65 % SOLN nasal spray Place 2 sprays into both nostrils as needed for congestion. 03/07/15  Yes Adline Potter, MD  Vortioxetine HBr (TRINTELLIX) 20 MG TABS Take 20 mg by mouth daily.   Yes Historical Provider, MD   chlorpheniramine-HYDROcodone (TUSSIONEX PENNKINETIC ER) 10-8 MG/5ML SUER Take 5 mLs by mouth 2 (two) times daily. 05/13/15   Lorin Picket, PA-C  triamcinolone cream (KENALOG) 0.1 % Apply 1 application topically 2 (two) times daily. 05/13/15   Lorin Picket, PA-C   Meds Ordered and Administered this Visit  Medications - No data to display  BP 129/79 mmHg  Pulse 99  Temp(Src) 98.6 F (37 C) (Oral)  Resp 16  Ht 5\' 4"  (1.626 m)  Wt 150 lb (68.04 kg)  BMI 25.73 kg/m2  SpO2 98% No data found.   Physical Exam  Constitutional: She is oriented to person, place, and time. She appears well-developed and well-nourished. No distress.  HENT:  Head: Normocephalic and atraumatic.  Right Ear: External ear normal.  Left Ear: External ear normal.  Nose: Nose normal.  Mouth/Throat: Oropharynx is clear and moist. No oropharyngeal exudate.  Eyes: Conjunctivae are normal. Pupils are equal, round, and reactive to light.  Neck: Normal range of motion. Neck supple.  Pulmonary/Chest: Breath sounds normal. No respiratory distress. She has no wheezes. She has no rales.  Musculoskeletal: Normal range of motion.  Lymphadenopathy:    She has no cervical adenopathy.  Neurological: She is alert and oriented to person, place, and time.  Skin: Skin is warm and dry. Rash noted. She is not diaphoretic.  Examination shows discrete 5 mm circular macular blanchable erythematous wheals in her torso breasts groin but none of it affecting the arms back and legs or feet. Excoriations are seen. There are sent central small punctate areas but no vesicles or papules present.  Psychiatric: She has a normal mood and affect. Her behavior is normal. Judgment and thought content normal.  Nursing note and vitals reviewed.   ED Course  Procedures (including critical care time)  Labs Review Labs Reviewed - No data to display  Imaging Review No results found.   Visual Acuity Review  Right Eye Distance:   Left Eye  Distance:   Bilateral Distance:    Right Eye Near:   Left Eye Near:    Bilateral Near:         MDM   1. Dermatitis due to unknown cause   2. Pulmonary emphysema, unspecified emphysema type [facility])    Discharge Medication List as of 05/13/2015  5:14 PM    START taking these medications   Details  chlorpheniramine-HYDROcodone (TUSSIONEX PENNKINETIC ER) 10-8 MG/5ML SUER Take 5 mLs by mouth 2 (two) times daily., Starting 05/13/2015, Until Discontinued, Print    triamcinolone cream (KENALOG) 0.1 % Apply 1 application topically 2 (two) times daily., Starting 05/13/2015, Until Discontinued, Print      Plan: 1. Test/x-ray results and diagnosis reviewed with patient 2. rx as per orders; risks, benefits, potential side effects reviewed with patient 3. Recommend supportive treatment with Topical steroids as necessary.  Is given her a small supply of hydrocodone syrup for her cough at nighttime only. If she is not improving she should contact Dr. Raul Del. Given her the name of Dr. Aubery Lapping dermatology if the areas do not improve. 4. F/u prn if symptoms worsen or don't improve     Lorin Picket, PA-C 05/13/15 1722

## 2015-06-22 ENCOUNTER — Encounter: Payer: Self-pay | Admitting: Family Medicine

## 2015-06-22 ENCOUNTER — Ambulatory Visit (INDEPENDENT_AMBULATORY_CARE_PROVIDER_SITE_OTHER): Payer: Managed Care, Other (non HMO) | Admitting: Family Medicine

## 2015-06-22 VITALS — BP 133/71 | HR 90 | Temp 98.4°F | Resp 16 | Ht 64.0 in | Wt 152.4 lb

## 2015-06-22 DIAGNOSIS — J449 Chronic obstructive pulmonary disease, unspecified: Secondary | ICD-10-CM | POA: Diagnosis not present

## 2015-06-22 DIAGNOSIS — E785 Hyperlipidemia, unspecified: Secondary | ICD-10-CM

## 2015-06-22 DIAGNOSIS — E559 Vitamin D deficiency, unspecified: Secondary | ICD-10-CM

## 2015-06-22 DIAGNOSIS — M150 Primary generalized (osteo)arthritis: Secondary | ICD-10-CM

## 2015-06-22 DIAGNOSIS — Z7689 Persons encountering health services in other specified circumstances: Secondary | ICD-10-CM

## 2015-06-22 DIAGNOSIS — Z7189 Other specified counseling: Secondary | ICD-10-CM

## 2015-06-22 DIAGNOSIS — M159 Polyosteoarthritis, unspecified: Secondary | ICD-10-CM

## 2015-06-22 DIAGNOSIS — F3181 Bipolar II disorder: Secondary | ICD-10-CM

## 2015-06-22 DIAGNOSIS — M797 Fibromyalgia: Secondary | ICD-10-CM | POA: Diagnosis not present

## 2015-06-22 MED ORDER — KETOROLAC TROMETHAMINE 60 MG/2ML IM SOLN
60.0000 mg | Freq: Once | INTRAMUSCULAR | Status: AC
  Administered 2015-06-22: 60 mg via INTRAMUSCULAR

## 2015-06-22 MED ORDER — CELECOXIB 200 MG PO CAPS: 200.0000 mg | ORAL_CAPSULE | Freq: Two times a day (BID) | ORAL | Status: DC

## 2015-06-22 MED ORDER — MILNACIPRAN HCL 100 MG PO TABS: 100.0000 mg | ORAL_TABLET | Freq: Two times a day (BID) | ORAL | Status: DC

## 2015-06-22 MED ORDER — KETOROLAC TROMETHAMINE 30 MG/ML IJ SOLN: 30.0000 mg | Freq: Once | INTRAMUSCULAR | Status: DC

## 2015-06-22 MED ORDER — MILNACIPRAN HCL 100 MG PO TABS: 100.0000 mg | ORAL_TABLET | Freq: Two times a day (BID) | ORAL | Status: AC

## 2015-06-22 NOTE — Assessment & Plan Note (Signed)
Managed by psychiatry 

## 2015-06-22 NOTE — Patient Instructions (Signed)
Try to take celebrex only once daily if possible. We can also try 100mg  twice daily.   Please come back to make appt for fasting labs.

## 2015-06-22 NOTE — Assessment & Plan Note (Signed)
Renewed savella for her symptoms. Consider rheumatology referral if symptoms are not improving.

## 2015-06-22 NOTE — Assessment & Plan Note (Signed)
Followed by pulmonology 

## 2015-06-22 NOTE — Progress Notes (Signed)
Subjective:    Patient ID: Christina Hurst, female    DOB: 06-19-1955, 60 y.o.   MRN: [id number]  HPI: Christina Hurst is a 60 y.o. female presenting on 06/22/2015 for Establish Care   HPI  Pt presents to establish care today. Previous care provider was Dr. Vicente Masson at [facility] and Armanda Magic [place], [place])  It has been 3 months since Her last PCP visit. Records from previous provider will be requested and reviewed. Current medical problems include:  Depression/ PTSD/ OCD/ Bipolar 2- Currently managed by Dr. Sharman Crate at [facility]. Diagnosed 4-5 years ago. Doing well on her current medication regimen. Is having trouble getting her medications due to [employer] Fibromyalgia: Diagnosed in 2002. Has been taking Savella for her symptoms. Has chronic fatigue with it. Hurts some days- savella helps to keep it under control. Neck and shoulders are hurting. Was previously seeing a rheumatologist.  Arthritis: Knees, elbows, and neck.  Sees chiropractor for neck pain. Has DDD of cervical spine. Currently having pain in piriformis area. Has occasional numbness and tingling- has been better since she had joint injections. Pt is requesting toradol shot for pain today.  COPD: Diagnosed this year. Has been seeing Dr. Raul Del for management. Recent breathing tests and PFTs. Appt upcoming in August.    Health maintenance:  Colonoscopy: Age 62- was normal. Would like to screen with cologuard today. Pap smear: Thinks 2-3 years ago. Normal in the past. Had it done at [facility]  Mammogram: All normal. Needs to get one.     Past Medical History  Diagnosis Date  . Depression   . Sleep disorder   . Arthritis   . Fibroadenoma   . Bipolar 2 disorder ([place])   . COPD (chronic obstructive pulmonary disease) ([place])    Social History   Social History  . Marital Status: Married    Spouse Name: N/A  . Number of Children: N/A  . Years of Education: N/A   Occupational History  . Not on file.   Social History Main Topics   . Smoking status: Former Smoker    Quit date: 03/07/1995  . Smokeless tobacco: Never Used  . Alcohol Use: 1.8 oz/week    3 Cans of beer per week  . Drug Use: No  . Sexual Activity: Not on file   Other Topics Concern  . Not on file   Social History Narrative   Family History  Problem Relation Age of Onset  . Depression Mother   . Arthritis Mother   . Hepatitis C Father   . Cancer Maternal Grandmother    Current Outpatient Prescriptions on File Prior to Visit  Medication Sig  . albuterol (PROVENTIL HFA;VENTOLIN HFA) 108 (90 Base) MCG/ACT inhaler Inhale 2 puffs into the lungs every 4 (four) hours as needed for wheezing or shortness of breath.  Marland Kitchen amitriptyline (ELAVIL) 75 MG tablet Take 75 mg by mouth at bedtime.  . budesonide-formoterol (SYMBICORT) 80-4.5 MCG/ACT inhaler Inhale 2 puffs into the lungs 2 (two) times daily.  . chlorpheniramine-HYDROcodone (TUSSIONEX PENNKINETIC ER) 10-8 MG/5ML SUER Take 5 mLs by mouth 2 (two) times daily.  . Cholecalciferol (VITAMIN D) 2000 units CAPS Take 1 capsule (2,000 Units total) by mouth daily.  . clorazepate (TRANXENE) 7.5 MG tablet Take 1 tablet by mouth daily.  . fluticasone (FLONASE) 50 MCG/ACT nasal spray Place 1 spray into both nostrils 2 (two) times daily as needed for allergies or rhinitis.  Marland Kitchen lamoTRIgine (LAMICTAL) 100 MG tablet Take 100 mg by mouth  daily.  . loratadine (CLARITIN) 10 MG tablet Take 1 tablet (10 mg total) by mouth daily as needed for allergies.  Marland Kitchen LORazepam (ATIVAN) 1 MG tablet Take 1 tablet by mouth every 6 (six) hours as needed.  . medroxyPROGESTERone (PROVERA) 2.5 MG tablet Take 1 tablet by mouth daily.  . Ospemifene (OSPHENA) 60 MG TABS Take 1 tablet by mouth daily.  . sodium chloride (OCEAN) 0.65 % SOLN nasal spray Place 2 sprays into both nostrils as needed for congestion.  . Vortioxetine HBr (TRINTELLIX) 20 MG TABS Take 20 mg by mouth daily.   No current facility-administered medications on file prior to visit.      Review of Systems  Constitutional: Negative for fever and chills.  HENT: Negative.   Respiratory: Negative for cough, chest tightness and wheezing.   Cardiovascular: Negative for chest pain and leg swelling.  Gastrointestinal: Negative for nausea, vomiting, abdominal pain, diarrhea and constipation.  Endocrine: Negative.  Negative for cold intolerance, heat intolerance, polydipsia, polyphagia and polyuria.  Genitourinary: Negative for dysuria and difficulty urinating.  Musculoskeletal: Positive for myalgias, back pain and arthralgias. Negative for neck pain and neck stiffness.  Neurological: Negative for dizziness, light-headedness and numbness.  Psychiatric/Behavioral: Negative.    Per HPI unless specifically indicated above     Objective:    BP 133/71 mmHg  Pulse 90  Temp(Src) 98.4 F (36.9 C) (Oral)  Resp 16  Ht 5\' 4"  (1.626 m)  Wt 152 lb 6.4 oz (69.128 kg)  BMI 26.15 kg/m2  SpO2 98%  Wt Readings from Last 3 Encounters:  06/22/15 152 lb 6.4 oz (69.128 kg)  05/13/15 150 lb (68.04 kg)  03/07/15 149 lb 3.2 oz (67.677 kg)    Physical Exam  Constitutional: She is oriented to person, place, and time. She appears well-developed and well-nourished.  HENT:  Head: Normocephalic and atraumatic.  Neck: Neck supple.  Cardiovascular: Normal rate, regular rhythm and normal heart sounds.  Exam reveals no gallop and no friction rub.   No murmur heard. Pulmonary/Chest: Effort normal and breath sounds normal. She has no wheezes. She exhibits no tenderness.  Abdominal: Soft. Normal appearance and bowel sounds are normal. She exhibits no distension and no mass. There is no tenderness. There is no rebound and no guarding.  Musculoskeletal: Normal range of motion. She exhibits no edema.       Right knee: She exhibits normal range of motion, no swelling, no effusion, no LCL laxity and no MCL laxity. Tenderness found. Lateral joint line tenderness noted.       Left knee: She exhibits normal  range of motion and no swelling. Tenderness found. Lateral joint line tenderness noted.       Lumbar back: She exhibits tenderness. She exhibits normal range of motion, no edema, no pain and normal pulse.       Back:  + tenderness at second rib and trapezius pressure points for fibromyalgia.   Lymphadenopathy:    She has no cervical adenopathy.  Neurological: She is alert and oriented to person, place, and time.  Skin: Skin is warm and dry.   Results for orders placed or performed in visit on 03/07/15  TSH  Result Value Ref Range   TSH 0.809 0.450 - 4.500 uIU/mL  Comprehensive Metabolic Panel (CMET)  Result Value Ref Range   Glucose 97 65 - 99 mg/dL   BUN 18 6 - 24 mg/dL   Creatinine, Ser 1.01 (H) 0.57 - 1.00 mg/dL   GFR calc non Af Amer 61 >  59 mL/min/1.73   GFR calc Af Amer 70 >59 mL/min/1.73   BUN/Creatinine Ratio 18 9 - 23   Sodium 145 (H) 134 - 144 mmol/L   Potassium 4.4 3.5 - 5.2 mmol/L   Chloride 103 96 - 106 mmol/L   CO2 23 18 - 29 mmol/L   Calcium 9.2 8.7 - 10.2 mg/dL   Total Protein 6.8 6.0 - 8.5 g/dL   Albumin 4.4 3.5 - 5.5 g/dL   Globulin, Total 2.4 1.5 - 4.5 g/dL   Albumin/Globulin Ratio 1.8 1.1 - 2.5   Bilirubin Total <0.2 0.0 - 1.2 mg/dL   Alkaline Phosphatase 90 39 - 117 IU/L   AST 16 0 - 40 IU/L   ALT 17 0 - 32 IU/L  CBC  Result Value Ref Range   WBC 7.8 3.4 - 10.8 x10E3/uL   RBC 4.19 3.77 - 5.28 x10E6/uL   Hemoglobin 12.0 11.1 - 15.9 g/dL   Hematocrit 36.3 34.0 - 46.6 %   MCV 87 79 - 97 fL   MCH 28.6 26.6 - 33.0 pg   MCHC 33.1 31.5 - 35.7 g/dL   RDW 13.4 12.3 - 15.4 %   Platelets 256 150 - 379 x10E3/uL  Vitamin D (25 hydroxy)  Result Value Ref Range   Vit D, 25-Hydroxy 24.3 (L) 30.0 - 100.0 ng/mL  B12  Result Value Ref Range   Vitamin B-12 504 211 - 946 pg/mL      Assessment & Plan:   Problem List Items Addressed This Visit      Respiratory   Mild chronic obstructive pulmonary disease ([place]) - Primary    Followed by pulmonology.          Musculoskeletal and Integument   Fibromyalgia    Renewed savella for her symptoms. Consider rheumatology referral if symptoms are not improving.      Relevant Medications   Milnacipran HCl (SAVELLA) 100 MG TABS tablet   celecoxib (CELEBREX) 200 MG capsule   celecoxib (CELEBREX) 200 MG capsule   ketorolac (TORADOL) injection 60 mg (Completed)   Other Relevant Orders   Comprehensive metabolic panel   Osteoarthritis    Toradol injection given per patient request for pain. Renewed celebrex- discussed reducing celebrex dosing to once daily or 100mg  BID due to cardiovascular risks. Made pt aware of possible cardiac complications with long-term NSAID use.       Relevant Medications   celecoxib (CELEBREX) 200 MG capsule   celecoxib (CELEBREX) 200 MG capsule   ketorolac (TORADOL) injection 60 mg (Completed)     Other   Bipolar 2 disorder ([place])    Managed by psychiatry.       Vitamin D deficiency    Recheck today.       Relevant Orders   VITAMIN D 25 Hydroxy (Vit-D Deficiency, Fractures)    Other Visit Diagnoses    Mild hyperlipidemia        check baseline lipid panel.     Relevant Orders    Lipid Profile       Meds ordered this encounter  Medications  . traZODone (DESYREL) 100 MG tablet    Sig:   . Milnacipran HCl (SAVELLA) 100 MG TABS tablet    Sig: Take 1 tablet (100 mg total) by mouth 2 (two) times daily.    Dispense:  60 tablet    Refill:  0    Order Specific Question:  Supervising Provider    Answer:  Arlis Porta [phone]  . celecoxib (CELEBREX) 200 MG  capsule    Sig: Take 1 capsule (200 mg total) by mouth 2 (two) times daily.    Dispense:  60 capsule    Refill:  0    Order Specific Question:  Supervising Provider    Answer:  Arlis Porta [id number]  . celecoxib (CELEBREX) 200 MG capsule    Sig: Take 1 capsule (200 mg total) by mouth 2 (two) times daily.    Dispense:  180 capsule    Refill:  3    Order Specific Question:  Supervising Provider     Answer:  Arlis Porta [phone]  . Milnacipran HCl (SAVELLA) 100 MG TABS tablet    Sig: Take 1 tablet (100 mg total) by mouth 2 (two) times daily.    Dispense:  180 tablet    Refill:  3    Order Specific Question:  Supervising Provider    Answer:  Arlis Porta [phone]  . DISCONTD: ketorolac (TORADOL) 30 MG/ML injection 30 mg    Sig:   . ketorolac (TORADOL) injection 60 mg    Sig:       Follow up plan: Return in about 3 months (around 09/22/2015) for arthrtis. Marland Kitchen

## 2015-06-22 NOTE — Assessment & Plan Note (Signed)
Recheck today. 

## 2015-06-22 NOTE — Assessment & Plan Note (Signed)
Toradol injection given per patient request for pain. Renewed celebrex- discussed reducing celebrex dosing to once daily or 100mg  BID due to cardiovascular risks. Made pt aware of possible cardiac complications with long-term NSAID use.

## 2015-08-09 ENCOUNTER — Ambulatory Visit: Payer: Managed Care, Other (non HMO)

## 2015-08-22 ENCOUNTER — Encounter (INDEPENDENT_AMBULATORY_CARE_PROVIDER_SITE_OTHER): Payer: Self-pay

## 2015-09-27 ENCOUNTER — Ambulatory Visit: Payer: Managed Care, Other (non HMO) | Admitting: Family Medicine

## 2015-11-20 ENCOUNTER — Ambulatory Visit: Payer: Managed Care, Other (non HMO) | Admitting: Adult Health Nurse Practitioner

## 2015-11-20 VITALS — BP 134/74 | HR 88 | Temp 98.3°F | Wt 154.0 lb

## 2015-11-20 DIAGNOSIS — F3181 Bipolar II disorder: Secondary | ICD-10-CM

## 2015-11-20 DIAGNOSIS — J439 Emphysema, unspecified: Secondary | ICD-10-CM

## 2015-11-20 DIAGNOSIS — Z0000 Encounter for general adult medical examination without abnormal findings: Secondary | ICD-10-CM

## 2015-11-20 MED ORDER — GUAIFENESIN 100 MG/5ML PO SOLN: 5.0000 mL | ORAL | 0 refills | Status: DC | PRN

## 2015-11-20 NOTE — Progress Notes (Signed)
Patient: Christina Hurst Female    DOB: 31-Mar-1955   60 y.o.   MRN: [id number] Visit Date: 11/20/2015  Today's Provider: Staci Acosta, NP   Chief Complaint  Patient presents with  . Arthritis    in knees and shoulder 7 on a scale of 1-10  . Emphysema  . Dementia    family history and wants to address her risk factors   Subjective:    HPI  Sees Dr. Ernie Hew at [facility] for Bipolar. Gives all psych medications.  States that her mother who has dementia is staying with her ans she is concerned because some aunts and uncles have also had dementia. She states that it is really scary. Mother also has bipolar- known to be violent.  States that she forgets minor things but still can recall faces, places and names.   Arthritis:  Taking Celebrex BID and clorazepate- Managed by Dr. Koleen Distance in [place].   Emphysema:  Using Albuterol every couple of days.  Using Symbicort BID.  Nonsmoker.     Allergies  Allergen Reactions  . Codeine Itching and Nausea Only  . Prednisone Hives   Previous Medications   ALBUTEROL (PROVENTIL HFA;VENTOLIN HFA) 108 (90 BASE) MCG/ACT INHALER    Inhale 2 puffs into the lungs every 4 (four) hours as needed for wheezing or shortness of breath.   AMITRIPTYLINE (ELAVIL) 75 MG TABLET    Take 75 mg by mouth at bedtime.   BUDESONIDE-FORMOTEROL (SYMBICORT) 80-4.5 MCG/ACT INHALER    Inhale 2 puffs into the lungs 2 (two) times daily.   CELECOXIB (CELEBREX) 200 MG CAPSULE    Take 1 capsule (200 mg total) by mouth 2 (two) times daily.   CELECOXIB (CELEBREX) 200 MG CAPSULE    Take 1 capsule (200 mg total) by mouth 2 (two) times daily.   CHLORPHENIRAMINE-HYDROCODONE (TUSSIONEX PENNKINETIC ER) 10-8 MG/5ML SUER    Take 5 mLs by mouth 2 (two) times daily.   CHOLECALCIFEROL (VITAMIN D) 2000 UNITS CAPS    Take 1 capsule (2,000 Units total) by mouth daily.   CLORAZEPATE (TRANXENE) 7.5 MG TABLET    Take 1 tablet by mouth daily.   FLUTICASONE (FLONASE) 50 MCG/ACT NASAL SPRAY     Place 1 spray into both nostrils 2 (two) times daily as needed for allergies or rhinitis.   LAMOTRIGINE (LAMICTAL) 100 MG TABLET    Take 100 mg by mouth daily.   LORATADINE (CLARITIN) 10 MG TABLET    Take 1 tablet (10 mg total) by mouth daily as needed for allergies.   LORAZEPAM (ATIVAN) 1 MG TABLET    Take 1 tablet by mouth every 6 (six) hours as needed.   MEDROXYPROGESTERONE (PROVERA) 2.5 MG TABLET    Take 1 tablet by mouth daily.   MILNACIPRAN HCL (SAVELLA) 100 MG TABS TABLET    Take 1 tablet (100 mg total) by mouth 2 (two) times daily.   MILNACIPRAN HCL (SAVELLA) 100 MG TABS TABLET    Take 1 tablet (100 mg total) by mouth 2 (two) times daily.   OSPEMIFENE (OSPHENA) 60 MG TABS    Take 1 tablet by mouth daily.   SODIUM CHLORIDE (OCEAN) 0.65 % SOLN NASAL SPRAY    Place 2 sprays into both nostrils as needed for congestion.   TRAZODONE (DESYREL) 100 MG TABLET       VORTIOXETINE HBR (TRINTELLIX) 20 MG TABS    Take 20 mg by mouth daily.    Review of Systems  All other systems reviewed and are negative.  Social History  Substance Use Topics  . Smoking status: Former Smoker    Quit date: 03/07/1995  . Smokeless tobacco: Never Used  . Alcohol use No   Objective:   BP 134/74   Pulse 88   Temp 98.3 F (36.8 C)   Wt 154 lb (69.9 kg)   BMI 26.43 kg/m   Physical Exam  Constitutional: She is oriented to person, place, and time. She appears well-developed and well-nourished.  HENT:  Head: Normocephalic and atraumatic.  Eyes: Pupils are equal, round, and reactive to light.  Neck: Normal range of motion. Neck supple.  Cardiovascular: Normal rate, regular rhythm and normal heart sounds.   Pulmonary/Chest: Effort normal and breath sounds normal.  Abdominal: Soft. Bowel sounds are normal.  Neurological: She is alert and oriented to person, place, and time.  Skin: Skin is warm and dry.  Psychiatric: She has a normal mood and affect.  Vitals reviewed.       Assessment & Plan:          Recommended to see Dr. Ernie Hew before next scheduled appointment.  Continue all current medications.  Continue to FU with Dr. Koleen Distance for Fibromyalgia.  Will order routine labs for today.  Guaifenesin syrup for cough.   FU in 4 months.   Staci Acosta, NP   [facility]

## 2015-11-21 LAB — COMPREHENSIVE METABOLIC PANEL
ALT: 18 IU/L (ref 0–32)
AST: 20 IU/L (ref 0–40)
Albumin/Globulin Ratio: 1.8 (ref 1.2–2.2)
Albumin: 4.8 g/dL (ref 3.6–4.8)
Alkaline Phosphatase: 130 IU/L — ABNORMAL HIGH (ref 39–117)
BUN/Creatinine Ratio: 22 (ref 12–28)
BUN: 22 mg/dL (ref 8–27)
Bilirubin Total: <0.2 mg/dL (ref 0.0–1.2)
CALCIUM: 9.4 mg/dL (ref 8.7–10.3)
CO2: 23 mmol/L (ref 18–29)
CREATININE: 1 mg/dL (ref 0.57–1.00)
Chloride: 98 mmol/L (ref 96–106)
GFR, EST AFRICAN AMERICAN: 71 mL/min/{1.73_m2} (ref >59)
GFR, EST NON AFRICAN AMERICAN: 61 mL/min/{1.73_m2} (ref >59)
Globulin, Total: 2.7 g/dL (ref 1.5–4.5)
Glucose: 122 mg/dL — ABNORMAL HIGH (ref 65–99)
Potassium: 3.8 mmol/L (ref 3.5–5.2)
Sodium: 138 mmol/L (ref 134–144)
TOTAL PROTEIN: 7.5 g/dL (ref 6.0–8.5)

## 2015-11-21 LAB — CBC
HEMATOCRIT: 38 % (ref 34.0–46.6)
Hemoglobin: 12.9 g/dL (ref 11.1–15.9)
MCH: 29.1 pg (ref 26.6–33.0)
MCHC: 33.9 g/dL (ref 31.5–35.7)
MCV: 86 fL (ref 79–97)
PLATELETS: 265 10*3/uL (ref 150–379)
RBC: 4.44 x10E6/uL (ref 3.77–5.28)
RDW: 13.1 % (ref 12.3–15.4)
WBC: 8.1 10*3/uL (ref 3.4–10.8)

## 2015-11-21 LAB — TSH: TSH: 1.37 u[IU]/mL (ref 0.450–4.500)

## 2015-11-21 LAB — HEMOGLOBIN A1C
ESTIMATED AVERAGE GLUCOSE: 123 mg/dL
Hgb A1c MFr Bld: 5.9 % — ABNORMAL HIGH (ref 4.8–5.6)

## 2015-11-23 ENCOUNTER — Telehealth: Payer: Self-pay

## 2015-11-23 NOTE — Telephone Encounter (Signed)
-----   Message from Staci Acosta, NP sent at 11/22/2015  5:37 PM EST ----- A1C shows prediabetes- monitor sugars, carbs and fat in diet. Liver enzyme slightly elevated but nothing to be alarmed about will recheck at next visit. Otherwise labs are stable.

## 2015-11-23 NOTE — Telephone Encounter (Signed)
Called pt with results. Left msg

## 2015-11-23 NOTE — Telephone Encounter (Signed)
Pt called in and gave her results. Suggested looking online for diets to help with her prediabetes numbers. Pt verbalized understanding.

## 2015-12-25 ENCOUNTER — Telehealth: Payer: Self-pay | Admitting: Pharmacist

## 2015-12-25 NOTE — Telephone Encounter (Signed)
Trintellix PAP submitted to manufacturer today.

## 2016-01-04 ENCOUNTER — Other Ambulatory Visit: Payer: Self-pay

## 2016-01-04 DIAGNOSIS — R059 Cough, unspecified: Secondary | ICD-10-CM

## 2016-01-04 DIAGNOSIS — R05 Cough: Secondary | ICD-10-CM

## 2016-01-04 MED ORDER — GUAIFENESIN 100 MG/5ML PO SOLN: 5.0000 mL | ORAL | 0 refills | Status: DC | PRN

## 2016-01-21 ENCOUNTER — Telehealth: Payer: Self-pay | Admitting: Pharmacist

## 2016-01-21 NOTE — Telephone Encounter (Signed)
[place] PAP submitted to manufacturer today.

## 2016-03-18 ENCOUNTER — Ambulatory Visit: Payer: Managed Care, Other (non HMO)

## 2016-06-09 ENCOUNTER — Telehealth: Payer: Self-pay | Admitting: Pharmacist

## 2016-06-09 NOTE — Telephone Encounter (Signed)
06/09/16 Please ignore prev. Note patient not yet completed re eligibility for [facility].Christina Hurst

## 2016-06-09 NOTE — Telephone Encounter (Signed)
06/09/16 Patient approved [facility] eligibility till Feb. 2019.Delos Haring

## 2016-06-17 ENCOUNTER — Telehealth: Payer: Self-pay | Admitting: Pharmacist

## 2016-06-17 NOTE — Telephone Encounter (Signed)
06-17-16 Faxed [employer] application for Trintellix 20mg  Take one tablet once daily, also faxed [employer] application for Savella 100mg  Take one tablet by mouth two times a day.

## 2016-07-23 ENCOUNTER — Emergency Department
Admission: EM | Admit: 2016-07-23 | Discharge: 2016-07-23 | Disposition: A | Discharge status: Home / Self Care | Payer: Managed Care, Other (non HMO) | Attending: Emergency Medicine | Admitting: Emergency Medicine

## 2016-07-23 ENCOUNTER — Emergency Department: Payer: Managed Care, Other (non HMO)

## 2016-07-23 DIAGNOSIS — R002 Palpitations: Secondary | ICD-10-CM | POA: Insufficient documentation

## 2016-07-23 DIAGNOSIS — Z87891 Personal history of nicotine dependence: Secondary | ICD-10-CM | POA: Insufficient documentation

## 2016-07-23 DIAGNOSIS — Z79899 Other long term (current) drug therapy: Secondary | ICD-10-CM | POA: Insufficient documentation

## 2016-07-23 DIAGNOSIS — J449 Chronic obstructive pulmonary disease, unspecified: Secondary | ICD-10-CM | POA: Insufficient documentation

## 2016-07-23 LAB — BASIC METABOLIC PANEL
ANION GAP: 10 (ref 5–15)
BUN: 21 mg/dL — ABNORMAL HIGH (ref 6–20)
CALCIUM: 9.8 mg/dL (ref 8.9–10.3)
CHLORIDE: 105 mmol/L (ref 101–111)
CO2: 26 mmol/L (ref 22–32)
CREATININE: 1.01 mg/dL — AB (ref 0.44–1.00)
GFR calc non Af Amer: 59 mL/min — ABNORMAL LOW (ref >60)
GLUCOSE: 161 mg/dL — AB (ref 65–99)
Potassium: 3.8 mmol/L (ref 3.5–5.1)
Sodium: 141 mmol/L (ref 135–145)

## 2016-07-23 LAB — CBC
HCT: 39.6 % (ref 35.0–47.0)
HEMOGLOBIN: 13.3 g/dL (ref 12.0–16.0)
MCH: 28.9 pg (ref 26.0–34.0)
MCHC: 33.7 g/dL (ref 32.0–36.0)
MCV: 85.9 fL (ref 80.0–100.0)
Platelets: 244 10*3/uL (ref 150–440)
RBC: 4.61 MIL/uL (ref 3.80–5.20)
RDW: 14.1 % (ref 11.5–14.5)
WBC: 6 10*3/uL (ref 3.6–11.0)

## 2016-07-23 LAB — TROPONIN I

## 2016-07-23 NOTE — ED Triage Notes (Signed)
Pt states that she has been having chest pain and a "flutter" to her L chest x 5 days.  Pt also states that she feels like she has fluid on her knees.  Pt denies any hx of cardiac issues.  Pt states she can see her chest move when the episode happens.  Pt denies that the episode is currently happening at this time.  Pt is A&Ox4, in NAD and ambulatory to triage.

## 2016-07-23 NOTE — ED Provider Notes (Signed)
[facility] Emergency Department Provider Note  Time seen: 7:07 PM  I have reviewed the triage vital signs and the nursing notes.   HISTORY  Chief Complaint Chest Pain    HPI Christina Hurst is a 61 y.o. female with a past medical history of bipolar, COPD, depression, presents to the emergency department for palpitations. According to the patient for the past 5 days she has been feeling intermittent palpitations in her chest that happen multiple times a day. She states she can't see her chest fluttering when it happens. States she feels somewhat short of breath but denies any chest pain. Denies any nausea or diaphoresis. States it happened once in the waiting room and once in chest x-ray today. Denies any symptoms during my evaluation.  Past Medical History:  Diagnosis Date  . Arthritis   . Bipolar 2 disorder ([place])   . COPD (chronic obstructive pulmonary disease) ([place])   . Depression   . Fibroadenoma   . Sleep disorder     Patient Active Problem List   Diagnosis Date Noted  . Mild chronic obstructive pulmonary disease ([place]) 05/09/2015  . Vitamin D deficiency 03/08/2015  . Dyspareunia in female 03/07/2015  . Bipolar 2 disorder ([place]) 03/07/2015  . Emphysema of lung ([place]) 03/07/2015  . Fatigue 03/07/2015  . Osteoarthritis 03/07/2015  . Allergic rhinitis 03/07/2015  . Fibromyalgia 10/09/2014    Past Surgical History:  Procedure Laterality Date  . CESAREAN SECTION      Prior to Admission medications   Medication Sig Start Date End Date Taking? Authorizing Provider  albuterol (PROVENTIL HFA;VENTOLIN HFA) 108 (90 Base) MCG/ACT inhaler Inhale 2 puffs into the lungs every 4 (four) hours as needed for wheezing or shortness of breath. 01/11/15   Frederich Cha, MD  amitriptyline (ELAVIL) 75 MG tablet Take 75 mg by mouth at bedtime.    [provider]  budesonide-formoterol (SYMBICORT) 80-4.5 MCG/ACT inhaler Inhale 2 puffs into the lungs 2 (two)  times daily. 02/15/15   Karamalegos, Devonne Doughty, DO  celecoxib (CELEBREX) 200 MG capsule Take 1 capsule (200 mg total) by mouth 2 (two) times daily. 06/22/15   Luciana Axe, NP  celecoxib (CELEBREX) 200 MG capsule Take 1 capsule (200 mg total) by mouth 2 (two) times daily. 06/22/15   Luciana Axe, NP  chlorpheniramine-HYDROcodone (TUSSIONEX PENNKINETIC ER) 10-8 MG/5ML SUER Take 5 mLs by mouth 2 (two) times daily. Patient not taking: Reported on 11/20/2015 05/13/15   Lorin Picket, PA-C  Cholecalciferol (VITAMIN D) 2000 units CAPS Take 1 capsule (2,000 Units total) by mouth daily. 03/08/15   Plonk, Gwyndolyn Saxon, MD  clorazepate (TRANXENE) 7.5 MG tablet Take 1 tablet by mouth daily. 02/23/15   [provider]  fluticasone (FLONASE) 50 MCG/ACT nasal spray Place 1 spray into both nostrils 2 (two) times daily as needed for allergies or rhinitis. 03/07/15   Plonk, Gwyndolyn Saxon, MD  guaiFENesin (ROBITUSSIN) 100 MG/5ML SOLN Take 5 mLs (100 mg total) by mouth every 4 (four) hours as needed for cough or to loosen phlegm. 01/04/16   Doles-Johnson, Teah, NP  lamoTRIgine (LAMICTAL) 100 MG tablet Take 100 mg by mouth daily.    [provider]  loratadine (CLARITIN) 10 MG tablet Take 1 tablet (10 mg total) by mouth daily as needed for allergies. Patient not taking: Reported on 11/20/2015 03/07/15   Adline Potter, MD  LORazepam (ATIVAN) 1 MG tablet Take 1 tablet by mouth every 6 (six) hours as needed. 02/21/15   [provider]  medroxyPROGESTERone (PROVERA) 2.5 MG tablet Take 1 tablet by mouth daily. 10/12/13   [provider]  Milnacipran HCl (SAVELLA) 100 MG TABS tablet Take 1 tablet (100 mg total) by mouth 2 (two) times daily. 06/22/15   Luciana Axe, NP  Milnacipran HCl (SAVELLA) 100 MG TABS tablet Take 1 tablet (100 mg total) by mouth 2 (two) times daily. 06/22/15   Krebs, Genevie Cheshire, NP  Ospemifene (OSPHENA) 60 MG TABS Take 1 tablet by mouth daily. 03/08/14   [provider]  sodium chloride (OCEAN) 0.65 % SOLN nasal spray Place 2 sprays into both nostrils as needed for congestion. Patient not taking: Reported on 11/20/2015 03/07/15   Adline Potter, MD  traZODone (DESYREL) 100 MG tablet  03/13/15   [provider]  Vortioxetine HBr (TRINTELLIX) 20 MG TABS Take 20 mg by mouth daily.    [provider]    Allergies  Allergen Reactions  . Codeine Itching and Nausea Only  . Prednisone Hives    Family History  Problem Relation Age of Onset  . Depression Mother   . Arthritis Mother   . Hepatitis C Father   . Cancer Maternal Grandmother     Social History Social History  Substance Use Topics  . Smoking status: Former Smoker    Quit date: 03/07/1995  . Smokeless tobacco: Never Used  . Alcohol use No    Review of Systems Constitutional: Negative for fever. Cardiovascular: Negative for chest pain.Occasional palpitations. Respiratory: Positive for shortness breath with the palpitations. Gastrointestinal: Negative for abdominal pain, vomiting and diarrhea. Genitourinary: Negative for dysuria. Musculoskeletal: Negative for back pain. Neurological: Negative for headaches, focal weakness or numbness. All other ROS negative  ____________________________________________   PHYSICAL EXAM:  VITAL SIGNS: ED Triage Vitals  Enc Vitals Group     BP 07/23/16 1738 (!) 150/68     Pulse Rate 07/23/16 1738 80     Resp 07/23/16 1738 18     Temp 07/23/16 1738 99 F (37.2 C)     Temp Source 07/23/16 1738 Oral     SpO2 07/23/16 1738 98 %     Weight 07/23/16 1733 160 lb (72.6 kg)     Height 07/23/16 1733 5\' 4"  (1.626 m)     Head Circumference --      Peak Flow --      Pain Score 07/23/16 1732 5     Pain Loc --      Pain Edu? --      Excl. in [place]? --     Constitutional: Alert and oriented. Well appearing and in no distress. Eyes: Normal exam ENT   Head: Normocephalic and atraumatic.   Mouth/Throat: Mucous membranes are  moist. Cardiovascular: Normal rate, regular rhythm. No murmur Respiratory: Normal respiratory effort without tachypnea nor retractions. Breath sounds are clear  Gastrointestinal: Soft and nontender. No distention.   Musculoskeletal: Nontender with normal range of motion in all extremities. No lower extremity tenderness or edema. Neurologic:  Normal speech and language. No gross focal neurologic deficits Skin:  Skin is warm, dry and intact.  Psychiatric: Mood and affect are normal.  ____________________________________________    EKG  EKG reviewed and interpreted by myself shows normal sinus rhythm at 81 bpm, narrow QRS, normal axis, normal intervals, no concerning ST changes.  ____________________________________________    RADIOLOGY  Chest x-ray negative  ____________________________________________   INITIAL IMPRESSION / ASSESSMENT AND PLAN / ED COURSE  Pertinent labs & imaging results that were available during  my care of the patient were reviewed by me and considered in my medical decision making (see chart for details).  Patient presents the emergency department for 5 days of palpitations worse over the past 1-2 days. States mild shortness of breath at times. Denies any nausea or diaphoresis. Denies any chest pain. Patient workup has been largely nonrevealing with normal labs including negative troponin. Reassuring EKG and a negative chest x-ray. We have monitor the patient in the emergency department grader at one hour on telemetry with no further events. I discussed with the patient continuing our ER monitoring versus discharge with a Holter monitor. Patient states she would rather go home and follow up with cardiology for a Holter monitor. We will refer to cardiology. I discussed return precautions with the patient.  ____________________________________________   FINAL CLINICAL IMPRESSION(S) / ED DIAGNOSES  Palpitations    Harvest Dark, MD 07/23/16 2012

## 2016-07-24 ENCOUNTER — Encounter: Payer: Self-pay | Admitting: Emergency Medicine

## 2016-07-24 ENCOUNTER — Other Ambulatory Visit: Payer: Self-pay

## 2016-07-24 ENCOUNTER — Emergency Department: Payer: Self-pay

## 2016-07-24 ENCOUNTER — Emergency Department
Admission: EM | Admit: 2016-07-24 | Discharge: 2016-07-24 | Disposition: A | Discharge status: Home / Self Care | Payer: Self-pay | Attending: Student in an Organized Health Care Education/Training Program | Admitting: Student in an Organized Health Care Education/Training Program

## 2016-07-24 DIAGNOSIS — J449 Chronic obstructive pulmonary disease, unspecified: Secondary | ICD-10-CM | POA: Insufficient documentation

## 2016-07-24 DIAGNOSIS — Z79899 Other long term (current) drug therapy: Secondary | ICD-10-CM | POA: Insufficient documentation

## 2016-07-24 DIAGNOSIS — R002 Palpitations: Secondary | ICD-10-CM | POA: Insufficient documentation

## 2016-07-24 DIAGNOSIS — R0789 Other chest pain: Secondary | ICD-10-CM | POA: Insufficient documentation

## 2016-07-24 DIAGNOSIS — Z87891 Personal history of nicotine dependence: Secondary | ICD-10-CM | POA: Insufficient documentation

## 2016-07-24 LAB — CBC
HCT: 39.1 % (ref 35.0–47.0)
HEMOGLOBIN: 13.5 g/dL (ref 12.0–16.0)
MCH: 29.9 pg (ref 26.0–34.0)
MCHC: 34.5 g/dL (ref 32.0–36.0)
MCV: 86.5 fL (ref 80.0–100.0)
PLATELETS: 245 10*3/uL (ref 150–440)
RBC: 4.52 MIL/uL (ref 3.80–5.20)
RDW: 13.8 % (ref 11.5–14.5)
WBC: 5.1 10*3/uL (ref 3.6–11.0)

## 2016-07-24 LAB — BASIC METABOLIC PANEL
Anion gap: 12 (ref 5–15)
BUN: 22 mg/dL — ABNORMAL HIGH (ref 6–20)
CALCIUM: 9.4 mg/dL (ref 8.9–10.3)
CO2: 20 mmol/L — AB (ref 22–32)
CREATININE: 1.09 mg/dL — AB (ref 0.44–1.00)
Chloride: 107 mmol/L (ref 101–111)
GFR calc Af Amer: >60 mL/min (ref >60)
GFR calc non Af Amer: 54 mL/min — ABNORMAL LOW (ref >60)
GLUCOSE: 190 mg/dL — AB (ref 65–99)
Potassium: 3.8 mmol/L (ref 3.5–5.1)
Sodium: 139 mmol/L (ref 135–145)

## 2016-07-24 LAB — FIBRIN DERIVATIVES D-DIMER (ARMC ONLY): Fibrin derivatives D-dimer (ARMC): 503 — ABNORMAL HIGH (ref 0.00–499.00)

## 2016-07-24 LAB — TROPONIN I

## 2016-07-24 MED ORDER — IOPAMIDOL (ISOVUE-370) INJECTION 76%
75.0000 mL | Freq: Once | INTRAVENOUS | Status: AC | PRN
  Administered 2016-07-24: 75 mL via INTRAVENOUS
  Filled 2016-07-24: qty 75

## 2016-07-24 NOTE — ED Triage Notes (Signed)
Pt from home with cp that began 6 days ago; described as tightness. She reports coming in for the same yesterday and the pain did not happen while she was here, so MD told her to come back if it started again. Pt states it began again this morning so she came back. Pt alert & oriented with NAD noted.

## 2016-07-24 NOTE — ED Provider Notes (Signed)
[facility] Emergency Department Provider Note    First MD Initiated Contact with Patient 07/24/16 1418     (approximate)  I have reviewed the triage vital signs and the nursing notes.   HISTORY  Chief Complaint Chest Pain    HPI Christina Hurst is a 61 y.o. female presents to the ER after being seen yesterday for similar complaints with concern for palpitations. States she has these multiple times a day and they are brief in nature. Denies any shortness of breath. No syncopal episodes. Does not feel lightheaded or faint. No numbness or tingling. Has not had these in the past. Denies any nausea or diaphoresis. No discomfort radiating up into her jaw or neck. Patient states that she was told to come back to the ER if her symptoms returned. Currently she is resting in the ER bed in no acute distress. Patient was given referral for cardiology for follow-up with Holter monitor but patient did not call to make an appointment.   Past Medical History:  Diagnosis Date  . Arthritis   . Bipolar 2 disorder ([place])   . COPD (chronic obstructive pulmonary disease) ([place])   . Depression   . Fibroadenoma   . Sleep disorder    Family History  Problem Relation Age of Onset  . Depression Mother   . Arthritis Mother   . Hepatitis C Father   . Cancer Maternal Grandmother    Past Surgical History:  Procedure Laterality Date  . CESAREAN SECTION     Patient Active Problem List   Diagnosis Date Noted  . Mild chronic obstructive pulmonary disease ([place]) 05/09/2015  . Vitamin D deficiency 03/08/2015  . Dyspareunia in female 03/07/2015  . Bipolar 2 disorder ([place]) 03/07/2015  . Emphysema of lung ([place]) 03/07/2015  . Fatigue 03/07/2015  . Osteoarthritis 03/07/2015  . Allergic rhinitis 03/07/2015  . Fibromyalgia 10/09/2014      Prior to Admission medications   Medication Sig Start Date End Date Taking? Authorizing Provider  albuterol (PROVENTIL HFA;VENTOLIN HFA)  108 (90 Base) MCG/ACT inhaler Inhale 2 puffs into the lungs every 4 (four) hours as needed for wheezing or shortness of breath. 01/11/15   Frederich Cha, MD  amitriptyline (ELAVIL) 75 MG tablet Take 75 mg by mouth at bedtime.    [provider]  budesonide-formoterol (SYMBICORT) 80-4.5 MCG/ACT inhaler Inhale 2 puffs into the lungs 2 (two) times daily. 02/15/15   Karamalegos, Devonne Doughty, DO  celecoxib (CELEBREX) 200 MG capsule Take 1 capsule (200 mg total) by mouth 2 (two) times daily. 06/22/15   Luciana Axe, NP  celecoxib (CELEBREX) 200 MG capsule Take 1 capsule (200 mg total) by mouth 2 (two) times daily. 06/22/15   Luciana Axe, NP  chlorpheniramine-HYDROcodone (TUSSIONEX PENNKINETIC ER) 10-8 MG/5ML SUER Take 5 mLs by mouth 2 (two) times daily. Patient not taking: Reported on 11/20/2015 05/13/15   Lorin Picket, PA-C  Cholecalciferol (VITAMIN D) 2000 units CAPS Take 1 capsule (2,000 Units total) by mouth daily. 03/08/15   Plonk, Gwyndolyn Saxon, MD  clorazepate (TRANXENE) 7.5 MG tablet Take 1 tablet by mouth daily. 02/23/15   [provider]  fluticasone (FLONASE) 50 MCG/ACT nasal spray Place 1 spray into both nostrils 2 (two) times daily as needed for allergies or rhinitis. 03/07/15   Plonk, Gwyndolyn Saxon, MD  guaiFENesin (ROBITUSSIN) 100 MG/5ML SOLN Take 5 mLs (100 mg total) by mouth every 4 (four) hours as needed for cough or to loosen phlegm. 01/04/16   Doles-Johnson,  Teah, NP  lamoTRIgine (LAMICTAL) 100 MG tablet Take 100 mg by mouth daily.    [provider]  loratadine (CLARITIN) 10 MG tablet Take 1 tablet (10 mg total) by mouth daily as needed for allergies. Patient not taking: Reported on 11/20/2015 03/07/15   Adline Potter, MD  LORazepam (ATIVAN) 1 MG tablet Take 1 tablet by mouth every 6 (six) hours as needed. 02/21/15   [provider]  medroxyPROGESTERone (PROVERA) 2.5 MG tablet Take 1 tablet by mouth daily. 10/12/13   [provider]  Milnacipran HCl  (SAVELLA) 100 MG TABS tablet Take 1 tablet (100 mg total) by mouth 2 (two) times daily. 06/22/15   Luciana Axe, NP  Milnacipran HCl (SAVELLA) 100 MG TABS tablet Take 1 tablet (100 mg total) by mouth 2 (two) times daily. 06/22/15   Krebs, Genevie Cheshire, NP  Ospemifene (OSPHENA) 60 MG TABS Take 1 tablet by mouth daily. 03/08/14   [provider]  sodium chloride (OCEAN) 0.65 % SOLN nasal spray Place 2 sprays into both nostrils as needed for congestion. Patient not taking: Reported on 11/20/2015 03/07/15   Adline Potter, MD  traZODone (DESYREL) 100 MG tablet  03/13/15   [provider]  Vortioxetine HBr (TRINTELLIX) 20 MG TABS Take 20 mg by mouth daily.    [provider]    Allergies Codeine and Prednisone    Social History Social History  Substance Use Topics  . Smoking status: Former Smoker    Quit date: 03/07/1995  . Smokeless tobacco: Never Used  . Alcohol use No    Review of Systems Patient denies headaches, rhinorrhea, blurry vision, numbness, shortness of breath, chest pain, edema, cough, abdominal pain, nausea, vomiting, diarrhea, dysuria, fevers, rashes or hallucinations unless otherwise stated above in HPI. ____________________________________________   PHYSICAL EXAM:  VITAL SIGNS: Vitals:   07/24/16 1217 07/24/16 1441  BP: 112/69   Pulse: 93 73  Resp: 18 13  Temp: 98.5 F (36.9 C)     Constitutional: Alert and oriented. Well appearing and in no acute distress. Eyes: Conjunctivae are normal.  Head: Atraumatic. Nose: No congestion/rhinnorhea. Mouth/Throat: Mucous membranes are moist.   Neck: No stridor. Painless ROM.  Cardiovascular: Normal rate, regular rhythm. Grossly normal heart sounds.  Good peripheral circulation. Respiratory: Normal respiratory effort.  No retractions. Lungs CTAB. Gastrointestinal: Soft and nontender. No distention. No abdominal bruits. No CVA tenderness. Genitourinary:  Musculoskeletal: No lower extremity  tenderness nor edema.  No joint effusions. Neurologic:  Normal speech and language. No gross focal neurologic deficits are appreciated. No facial droop Skin:  Skin is warm, dry and intact. No rash noted. Psychiatric: Mood and affect are normal. Speech and behavior are normal.  ____________________________________________   LABS (all labs ordered are listed, but only abnormal results are displayed)  Results for orders placed or performed during the hospital encounter of 07/24/16 (from the past 24 hour(s))  Basic metabolic panel     Status: Abnormal   Collection Time: 07/24/16 12:14 PM  Result Value Ref Range   Sodium 139 135 - 145 mmol/L   Potassium 3.8 3.5 - 5.1 mmol/L   Chloride 107 101 - 111 mmol/L   CO2 20 (L) 22 - 32 mmol/L   Glucose, Bld 190 (H) 65 - 99 mg/dL   BUN 22 (H) 6 - 20 mg/dL   Creatinine, Ser 1.09 (H) 0.44 - 1.00 mg/dL   Calcium 9.4 8.9 - 10.3 mg/dL   GFR calc non Af Amer 54 (L) >60 mL/min  GFR calc Af Amer >60 >60 mL/min   Anion gap 12 5 - 15  CBC     Status: None   Collection Time: 07/24/16 12:14 PM  Result Value Ref Range   WBC 5.1 3.6 - 11.0 K/uL   RBC 4.52 3.80 - 5.20 MIL/uL   Hemoglobin 13.5 12.0 - 16.0 g/dL   HCT 39.1 35.0 - 47.0 %   MCV 86.5 80.0 - 100.0 fL   MCH 29.9 26.0 - 34.0 pg   MCHC 34.5 32.0 - 36.0 g/dL   RDW 13.8 11.5 - 14.5 %   Platelets 245 150 - 440 K/uL  Troponin I     Status: None   Collection Time: 07/24/16 12:14 PM  Result Value Ref Range   Troponin I <0.03 <0.03 ng/mL  Fibrin derivatives D-Dimer ([facility] only)     Status: Abnormal   Collection Time: 07/24/16  2:35 PM  Result Value Ref Range   Fibrin derivatives D-dimer ([facility]) 503.00 (H) 0.00 - 499.00   ____________________________________________  EKG My review and personal interpretation at Time: 12:12   Indication: chesdt pain  Rate: 90  Rhythm: sinus Axis: normal Other: normal intervals, non specific st changes, inferor t wave inversion unchanged from previously    ____________________________________________  RADIOLOGY  I personally reviewed all radiographic images ordered to evaluate for the above acute complaints and reviewed radiology reports and findings.  These findings were personally discussed with the patient.  Please see medical record for radiology report.  ____________________________________________   PROCEDURES  Procedure(s) performed:  Procedures    Critical Care performed: no ____________________________________________   INITIAL IMPRESSION / ASSESSMENT AND PLAN / ED COURSE  Pertinent labs & imaging results that were available during my care of the patient were reviewed by me and considered in my medical decision making (see chart for details).  DDX: palpitations, dysrhythmia, acs, pe, copd, dehydration  Christina Hurst is a 61 y.o. who presents to the ED with palpitations and chest discomfort as described above. EKG shows no evidence of acute ischemia or dysrhythmia. Her troponin is negative. Based on her symptoms and description of that I do not feel this clinically consistent with ACS. Patient has been kept on monitor in the ER without any evidence of dysrhythmia. Based on her discomfort and intermittent shortness of breath d-dimer was sent to further risk stratify for PE. D-dimer was elevated therefore CT angiogram is ordered to exclude pulmonary embolism. CT angiogram shows no evidence of pulmonary embolism but does show chronic granulomatous disease. Findings were discussed with patient. Patient otherwise stable and appropriate for follow-up with cardiology for Holter monitor placement.      ____________________________________________   FINAL CLINICAL IMPRESSION(S) / ED DIAGNOSES  Final diagnoses:  Palpitations      NEW MEDICATIONS STARTED DURING THIS VISIT:  New Prescriptions   No medications on file     Note:  This document was prepared using Dragon voice recognition software and may include  unintentional dictation errors.    Merlyn Lot, MD 07/24/16 [phone]

## 2016-07-24 NOTE — ED Notes (Signed)

## 2016-07-25 ENCOUNTER — Telehealth: Payer: Self-pay

## 2016-07-25 ENCOUNTER — Telehealth: Payer: Self-pay | Admitting: Pharmacy Technician

## 2016-07-25 NOTE — Telephone Encounter (Signed)
Pt has been scheduled for 09/04/16  Nothing further needed.

## 2016-07-25 NOTE — Telephone Encounter (Signed)
Pt was seen in ED on 07/24/16 for CP   Has not seen anyone in our office before  Please call patient.

## 2016-07-25 NOTE — Telephone Encounter (Signed)
Patient eligible to receive medication assistance at [facility] through 2018, as long as eligibility requirements continue to be met.  Christina Hurst J. Jerrye Seebeck Care Manager [facility] 

## 2016-08-21 ENCOUNTER — Encounter: Payer: Self-pay | Admitting: Pharmacist

## 2016-08-21 ENCOUNTER — Ambulatory Visit: Payer: Managed Care, Other (non HMO) | Admitting: Urology

## 2016-08-21 ENCOUNTER — Ambulatory Visit: Payer: Self-pay | Admitting: Pharmacist

## 2016-08-21 VITALS — BP 125/74 | HR 83 | Temp 98.3°F | Wt 161.8 lb

## 2016-08-21 VITALS — BP 124/78 | Ht 64.0 in | Wt 152.0 lb

## 2016-08-21 DIAGNOSIS — J439 Emphysema, unspecified: Secondary | ICD-10-CM

## 2016-08-21 DIAGNOSIS — Z79899 Other long term (current) drug therapy: Secondary | ICD-10-CM

## 2016-08-21 DIAGNOSIS — R918 Other nonspecific abnormal finding of lung field: Secondary | ICD-10-CM

## 2016-08-21 MED ORDER — ALBUTEROL SULFATE HFA 108 (90 BASE) MCG/ACT IN AERS: 2.0000 | INHALATION_SPRAY | Freq: Four times a day (QID) | RESPIRATORY_TRACT | 2 refills | Status: DC | PRN

## 2016-08-21 NOTE — Progress Notes (Signed)
[facility] Visit Note  Patient: Christina Hurst MRN: [id number] Date of Birth: 1955/07/01 PCP: Andres Shad, MD   Alyson Locket 61 y.o. female presents for an initial MTM visit today.  BP 124/78   Ht 5\' 4"  (1.626 m)   Wt 152 lb (68.9 kg)   BMI 26.09 kg/m   Patient Information   Past Medical History:  Diagnosis Date  . Arthritis   . Bipolar 2 disorder ([place])   . COPD (chronic obstructive pulmonary disease) ([place])   . Depression   . Fibroadenoma   . Sleep disorder       Past Surgical History:  Procedure Laterality Date  . CESAREAN SECTION       Family History  Problem Relation Age of Onset  . Depression Mother   . Arthritis Mother   . Hepatitis C Father   . Cancer Maternal Grandmother     New Diagnoses (since last visit): n/a  Family Support: lives with husband, he is very supportive           History  Alcohol Use No      History  Smoking Status  . Former Smoker  . Quit date: 03/07/1995  Smokeless Tobacco  . Never Used      Health Maintenance  Topic Date Due  . Hepatitis C Screening  06/30/55  . HIV Screening  06/12/1970  . PAP SMEAR  06/11/1976  . MAMMOGRAM  06/26/2014  . COLONOSCOPY  12/06/2015  . INFLUENZA VACCINE  08/06/2016  . TETANUS/TDAP  03/06/2025   Outpatient Encounter Prescriptions as of 08/21/2016  Medication Sig  . albuterol (PROVENTIL HFA;VENTOLIN HFA) 108 (90 Base) MCG/ACT inhaler Inhale 2 puffs into the lungs every 4 (four) hours as needed for wheezing or shortness of breath.  Marland Kitchen amitriptyline (ELAVIL) 75 MG tablet Take 75 mg by mouth at bedtime.  Marland Kitchen ascorbic acid (VITAMIN C) 500 MG tablet Take 500 mg by mouth daily.  . celecoxib (CELEBREX) 200 MG capsule Take 1 capsule (200 mg total) by mouth 2 (two) times daily.  . clorazepate (TRANXENE) 7.5 MG tablet Take 1 tablet by mouth daily.  . fluticasone (FLONASE) 50 MCG/ACT nasal spray Place 1 spray into both nostrils 2 (two) times daily as needed  for allergies or rhinitis.  . Ginkgo Biloba Extract (GNP GINGKO BILOBA EXTRACT) 60 MG CAPS Take 1 capsule by mouth daily.  Marland Kitchen lamoTRIgine (LAMICTAL) 100 MG tablet Take 100 mg by mouth daily.  Marland Kitchen loratadine (CLARITIN) 10 MG tablet Take 1 tablet (10 mg total) by mouth daily as needed for allergies.  . traZODone (DESYREL) 100 MG tablet Take 100 mg by mouth at bedtime.   . Turmeric 500 MG CAPS Take 1 capsule by mouth daily.  . Vortioxetine HBr (TRINTELLIX) 20 MG TABS Take 20 mg by mouth daily.  . budesonide-formoterol (SYMBICORT) 80-4.5 MCG/ACT inhaler Inhale 2 puffs into the lungs 2 (two) times daily. (Patient not taking: Reported on 08/21/2016)  . Cholecalciferol (VITAMIN D) 2000 units CAPS Take 1 capsule (2,000 Units total) by mouth daily. (Patient not taking: Reported on 08/21/2016)  . Milnacipran HCl (SAVELLA) 100 MG TABS tablet Take 1 tablet (100 mg total) by mouth 2 (two) times daily. (Patient not taking: Reported on 08/21/2016)  . [DISCONTINUED] celecoxib (CELEBREX) 200 MG capsule Take 1 capsule (200 mg total) by mouth 2 (two) times daily.  . [DISCONTINUED] chlorpheniramine-HYDROcodone (TUSSIONEX PENNKINETIC ER) 10-8 MG/5ML SUER Take 5 mLs by mouth 2 (two) times daily. (Patient not taking: Reported on  11/20/2015)  . [DISCONTINUED] guaiFENesin (ROBITUSSIN) 100 MG/5ML SOLN Take 5 mLs (100 mg total) by mouth every 4 (four) hours as needed for cough or to loosen phlegm. (Patient not taking: Reported on 08/21/2016)  . [DISCONTINUED] LORazepam (ATIVAN) 1 MG tablet Take 1 tablet by mouth every 6 (six) hours as needed.  . [DISCONTINUED] medroxyPROGESTERone (PROVERA) 2.5 MG tablet Take 1 tablet by mouth daily.  . [DISCONTINUED] Milnacipran HCl (SAVELLA) 100 MG TABS tablet Take 1 tablet (100 mg total) by mouth 2 (two) times daily.  . [DISCONTINUED] Ospemifene (OSPHENA) 60 MG TABS Take 1 tablet by mouth daily.  . [DISCONTINUED] sodium chloride (OCEAN) 0.65 % SOLN nasal spray Place 2 sprays into both nostrils as  needed for congestion. (Patient not taking: Reported on 11/20/2015)   No facility-administered encounter medications on file as of 08/21/2016.     Assessment and Plan:  Compliance/Adherance: pt know 3W's (what/when/why) for all her medications without prompting. Pt uses an organizer/pillbox for her medications weekly and states that she does not miss doses. She refills her medications appropriately.  Fibromyalgia/Chronic Fatigue Syndrome: states that she is doing well on her medications. States she will be glad to get back on the savella since it helps with the muscle spasms. S/w Langley Adie and Ocie Cornfield has been approved and will be rcv at [facility] within the next 2 weeks.  Bi-polar II: pt states that she does have some manic episodes and she has increased stress/anxiety since her mother is not doing well and has been very difficult to care for. States that she is starting to feel better now that she has access to the Trintellix. She states that the symptoms are less and manageable as long as she is on the medication. She [facility] therapist [facility] at [facility]. Her next appt with [facility] is 8/21.  COPD: pt does not have rescue inhaler and states she can not afford it. She has an active rx at [facility] in [place] and we will transfer this to [facility]. When she has the ventolin she states that she uses it daily.  PAP application for Symbicort has been denied due to income. Pap coordinator is appealing to manufacturer. She is currently getting samples from her MD. Suggested Mucinex DM generic scheduled twice daily until weather cools off to help with coughing. Suggested taking loratadine scheduled daily.   Netta Neat, PharmD, [facility]) [phone]

## 2016-08-21 NOTE — Progress Notes (Signed)
Patient: Christina Hurst Female    DOB: 10-06-1955   61 y.o.   MRN: [id number] Visit Date: 08/21/2016  Today's Provider: Zara Council, PA-C   Chief Complaint  Patient presents with  . Follow-up    CT scan--would like to compare last 2 scans  . Shortness of Breath    requesting an inhaler   Subjective:    HPI Sees Dr. Ernie Hew at [facility] for Bipolar. Gives all psych medications.  States that her mother who has dementia is staying with her ans she is concerned because some aunts and uncles have also had dementia. She states that it is really scary. Mother also has bipolar- known to be violent.  States that she forgets minor things but still can recall faces, places and names.   Arthritis:  Taking Celebrex BID and clorazepate- Managed by Dr. Koleen Distance in [place].   Emphysema:  Using Albuterol every couple of days.  Using Symbicort BID.  Nonsmoker.   Reviewed chest x-ray - explained to patient that PFT are a better predictor of COPD progression  Seen in ED for possible PE - ruled out    Allergies  Allergen Reactions  . Codeine Itching and Nausea Only  . Prednisone Hives   Previous Medications   ALBUTEROL (PROVENTIL HFA;VENTOLIN HFA) 108 (90 BASE) MCG/ACT INHALER    Inhale 2 puffs into the lungs every 4 (four) hours as needed for wheezing or shortness of breath.   AMITRIPTYLINE (ELAVIL) 75 MG TABLET    Take 75 mg by mouth at bedtime.   ASCORBIC ACID (VITAMIN C) 500 MG TABLET    Take 500 mg by mouth daily.   BUDESONIDE-FORMOTEROL (SYMBICORT) 80-4.5 MCG/ACT INHALER    Inhale 2 puffs into the lungs 2 (two) times daily.   CELECOXIB (CELEBREX) 200 MG CAPSULE    Take 1 capsule (200 mg total) by mouth 2 (two) times daily.   CHOLECALCIFEROL (VITAMIN D) 2000 UNITS CAPS    Take 1 capsule (2,000 Units total) by mouth daily.   CLORAZEPATE (TRANXENE) 7.5 MG TABLET    Take 1 tablet by mouth daily.   FLUTICASONE (FLONASE) 50 MCG/ACT NASAL SPRAY    Place 1 spray into both nostrils 2 (two)  times daily as needed for allergies or rhinitis.   GINKGO BILOBA EXTRACT (GNP GINGKO BILOBA EXTRACT) 60 MG CAPS    Take 1 capsule by mouth daily.   LAMOTRIGINE (LAMICTAL) 100 MG TABLET    Take 100 mg by mouth daily.   LORATADINE (CLARITIN) 10 MG TABLET    Take 1 tablet (10 mg total) by mouth daily as needed for allergies.   MILNACIPRAN HCL (SAVELLA) 100 MG TABS TABLET    Take 1 tablet (100 mg total) by mouth 2 (two) times daily.   TRAZODONE (DESYREL) 100 MG TABLET    Take 100 mg by mouth at bedtime.    TURMERIC 500 MG CAPS    Take 1 capsule by mouth daily.   VORTIOXETINE HBR (TRINTELLIX) 20 MG TABS    Take 20 mg by mouth daily.    Review of Systems  Respiratory: Positive for shortness of breath.   All other systems reviewed and are negative.   Social History  Substance Use Topics  . Smoking status: Former Smoker    Quit date: 03/07/1995  . Smokeless tobacco: Never Used  . Alcohol use No   Objective:   BP 125/74   Pulse 83   Temp 98.3 F (36.8 C)   Wt 161 lb 12.8 oz (73.4  kg)   BMI 27.77 kg/m   Physical Exam  Constitutional: She is oriented to person, place, and time. She appears well-developed and well-nourished.  HENT:  Head: Normocephalic and atraumatic.  Eyes: Pupils are equal, round, and reactive to light.  Neck: Normal range of motion. Neck supple.  Cardiovascular: Normal rate, regular rhythm and normal heart sounds.   Pulmonary/Chest: Effort normal and breath sounds normal.  Abdominal: Soft. Bowel sounds are normal.  Neurological: She is alert and oriented to person, place, and time.  Skin: Skin is warm and dry.  Psychiatric: She has a normal mood and affect.  Vitals reviewed.       Assessment & Plan:     1. COPD  - script for Ventolin given  - RTC in one month for recheck on COPD  2. Ground-glass opacity seen on CT scan  - Chest CT to be repeated 3 months  - needs charity care     Recommended to see Dr. Ernie Hew before next scheduled appointment.   Continue all current medications.  Continue to FU with Dr. Teryl Lucy for Fibromyalgia.  Will order routine labs for today.  Guaifenesin syrup for cough.   FU in 4 months.   Zara Council, PA-C   [facility]

## 2016-09-02 ENCOUNTER — Telehealth: Payer: Self-pay

## 2016-09-02 NOTE — Telephone Encounter (Signed)
Received PAP application from [facility] for Ventolin placed for provider to sign. 

## 2016-09-02 NOTE — Telephone Encounter (Signed)
Placed signed application/script in [facility] folder for pickup. 

## 2016-09-03 NOTE — Progress Notes (Deleted)
Cardiology Office Note  Date:  09/03/2016   ID:  Stephane, Junkins 22-Feb-1955, MRN [id number]  PCP:  Andres Shad, MD   No chief complaint on file.   HPI:   Christina Hurst is a 61 y.o. female bipolar, COPD, depression, presents to the emergency department for palpitations.   presents to the ER 7/18 and 7/19   palpitations. States she has these multiple times a day and they are brief in nature.   past 5 days she has been feeling intermittent palpitations in her chest that happen multiple times a day. She states she can't see her chest fluttering when it happens. States she feels somewhat short of breath but denies any chest pain  Denies any shortness of breath. No syncopal episodes. Does not feel lightheaded or faint. No numbness or tingling. Has not had these in the past. Denies any nausea or diaphoresis. No discomfort radiating up into her jaw or neck. Patient states that she was told to come back to the ER if her symptoms returned. Currently she is resting in the ER bed in no acute distress.   Patient was given referral for cardiology for follow-up with Holter monitor but patient did not call to make an appointment.  PMH:   has a past medical history of Arthritis; Bipolar 2 disorder ([place]); COPD (chronic obstructive pulmonary disease) ([place]); Depression; Fibroadenoma; and Sleep disorder.  PSH:    Past Surgical History:  Procedure Laterality Date  . CESAREAN SECTION      Current Outpatient Prescriptions  Medication Sig Dispense Refill  . albuterol (PROVENTIL HFA;VENTOLIN HFA) 108 (90 Base) MCG/ACT inhaler Inhale 2 puffs into the lungs every 4 (four) hours as needed for wheezing or shortness of breath. (Patient not taking: Reported on 08/21/2016) 1 Inhaler 0  . albuterol (PROVENTIL HFA;VENTOLIN HFA) 108 (90 Base) MCG/ACT inhaler Inhale 2 puffs into the lungs every 6 (six) hours as needed for wheezing or shortness of breath. 1 Inhaler 2  . amitriptyline (ELAVIL) 75  MG tablet Take 75 mg by mouth at bedtime.    Marland Kitchen ascorbic acid (VITAMIN C) 500 MG tablet Take 500 mg by mouth daily.    . budesonide-formoterol (SYMBICORT) 80-4.5 MCG/ACT inhaler Inhale 2 puffs into the lungs 2 (two) times daily. (Patient not taking: Reported on 08/21/2016) 1 Inhaler 0  . celecoxib (CELEBREX) 200 MG capsule Take 1 capsule (200 mg total) by mouth 2 (two) times daily. 60 capsule 0  . Cholecalciferol (VITAMIN D) 2000 units CAPS Take 1 capsule (2,000 Units total) by mouth daily. 30 capsule   . clorazepate (TRANXENE) 7.5 MG tablet Take 1 tablet by mouth daily.    . fluticasone (FLONASE) 50 MCG/ACT nasal spray Place 1 spray into both nostrils 2 (two) times daily as needed for allergies or rhinitis. (Patient not taking: Reported on 08/21/2016) 16 g 0  . Ginkgo Biloba Extract (GNP GINGKO BILOBA EXTRACT) 60 MG CAPS Take 1 capsule by mouth daily.    Marland Kitchen lamoTRIgine (LAMICTAL) 100 MG tablet Take 100 mg by mouth daily.    Marland Kitchen loratadine (CLARITIN) 10 MG tablet Take 1 tablet (10 mg total) by mouth daily as needed for allergies. 30 tablet 0  . Milnacipran HCl (SAVELLA) 100 MG TABS tablet Take 1 tablet (100 mg total) by mouth 2 (two) times daily. 60 tablet 0  . traZODone (DESYREL) 100 MG tablet Take 100 mg by mouth at bedtime.     . Turmeric 500 MG CAPS Take 1 capsule by mouth daily.    Marland Kitchen  Vortioxetine HBr (TRINTELLIX) 20 MG TABS Take 20 mg by mouth daily.     No current facility-administered medications for this visit.      Allergies:   Codeine and Prednisone   Social History:  The patient  reports that she quit smoking about 21 years ago. She has never used smokeless tobacco. She reports that she does not drink alcohol or use drugs.   Family History:   family history includes Arthritis in her mother; Bipolar disorder in her sister; Cancer in her maternal grandmother; Depression in her mother; Hepatitis C in her father; Schizophrenia in her mother.    Review of Systems: ROS   PHYSICAL  EXAM: VS:  There were no vitals taken for this visit. , BMI There is no height or weight on file to calculate BMI. GEN: Well nourished, well developed, in no acute distress HEENT: normal Neck: no JVD, carotid bruits, or masses Cardiac: RRR; no murmurs, rubs, or gallops,no edema  Respiratory:  clear to auscultation bilaterally, normal work of breathing GI: soft, nontender, nondistended, + BS MS: no deformity or atrophy Skin: warm and dry, no rash Neuro:  Strength and sensation are intact Psych: euthymic mood, full affect    Recent Labs: 11/20/2015: ALT 18; TSH 1.370 07/24/2016: BUN 22; Creatinine, Ser 1.09; Hemoglobin 13.5; Platelets 245; Potassium 3.8; Sodium 139    Lipid Panel No results found for: CHOL, HDL, LDLCALC, TRIG    Wt Readings from Last 3 Encounters:  08/21/16 161 lb 12.8 oz (73.4 kg)  08/21/16 152 lb (68.9 kg)  07/24/16 160 lb (72.6 kg)       ASSESSMENT AND PLAN:  No diagnosis found.   Disposition:   F/U  6 months  No orders of the defined types were placed in this encounter.    Signed, Esmond Plants, M.D., Ph.D. 09/03/2016  [place], [place]

## 2016-09-04 ENCOUNTER — Encounter: Payer: Self-pay | Admitting: Cardiovascular Disease

## 2016-09-04 ENCOUNTER — Ambulatory Visit: Payer: Self-pay | Admitting: Cardiovascular Disease

## 2016-09-11 ENCOUNTER — Telehealth: Payer: Self-pay | Admitting: Pharmacist

## 2016-09-11 NOTE — Telephone Encounter (Signed)
09/11/16 Called [facility] for refill on Trintellix 20mg .Delos Haring

## 2016-09-25 ENCOUNTER — Ambulatory Visit: Payer: Managed Care, Other (non HMO) | Admitting: Urology

## 2016-09-25 VITALS — BP 122/71 | HR 106 | Temp 98.4°F | Wt 163.6 lb

## 2016-09-25 DIAGNOSIS — J439 Emphysema, unspecified: Secondary | ICD-10-CM

## 2016-09-25 DIAGNOSIS — F3181 Bipolar II disorder: Secondary | ICD-10-CM

## 2016-09-25 DIAGNOSIS — R918 Other nonspecific abnormal finding of lung field: Secondary | ICD-10-CM

## 2016-09-25 NOTE — Progress Notes (Signed)
Patient: Christina Hurst Female    DOB: [birth year]   61 y.o.   MRN: [id number] Visit Date: 09/25/2016  Today's Provider: [facility]   No chief complaint on file.  Subjective:    Shortness of Breath    Sees Dr. Ernie Hew at [facility] for Bipolar. Gives all psych medications.  States that her mother who has dementia is staying with her sister now   States that she forgets minor things but still can recall faces, places and names.   Arthritis:  Taking Celebrex BID, Savella and clorazepate- Managed by Dr. Koleen Distance in [place].   Emphysema:  Using Albuterol 2 puffs twice daily   Unable to get the Symbicort - currently in appeals Nonsmoker.   Reviewed chest x-ray - explained to patient that PFT are a better predictor of COPD progression  Seen in ED for possible PE - ruled out  Patient is having right ear pain for x 1 with drainage      Allergies  Allergen Reactions  . Codeine Itching and Nausea Only  . Prednisone Hives   Previous Medications   ALBUTEROL (PROVENTIL HFA;VENTOLIN HFA) 108 (90 BASE) MCG/ACT INHALER    Inhale 2 puffs into the lungs every 4 (four) hours as needed for wheezing or shortness of breath.   ALBUTEROL (PROVENTIL HFA;VENTOLIN HFA) 108 (90 BASE) MCG/ACT INHALER    Inhale 2 puffs into the lungs every 6 (six) hours as needed for wheezing or shortness of breath.   AMITRIPTYLINE (ELAVIL) 75 MG TABLET    Take 75 mg by mouth at bedtime.   ASCORBIC ACID (VITAMIN C) 500 MG TABLET    Take 500 mg by mouth daily.   BUDESONIDE-FORMOTEROL (SYMBICORT) 80-4.5 MCG/ACT INHALER    Inhale 2 puffs into the lungs 2 (two) times daily.   CELECOXIB (CELEBREX) 200 MG CAPSULE    Take 1 capsule (200 mg total) by mouth 2 (two) times daily.   CHOLECALCIFEROL (VITAMIN D) 2000 UNITS CAPS    Take 1 capsule (2,000 Units total) by mouth daily.   CLORAZEPATE (TRANXENE) 7.5 MG TABLET    Take 1 tablet by mouth daily.   FLUTICASONE (FLONASE) 50 MCG/ACT NASAL SPRAY    Place 1 spray into  both nostrils 2 (two) times daily as needed for allergies or rhinitis.   GINKGO BILOBA EXTRACT (GNP GINGKO BILOBA EXTRACT) 60 MG CAPS    Take 1 capsule by mouth daily.   LAMOTRIGINE (LAMICTAL) 100 MG TABLET    Take 100 mg by mouth daily.   LORATADINE (CLARITIN) 10 MG TABLET    Take 1 tablet (10 mg total) by mouth daily as needed for allergies.   MILNACIPRAN HCL (SAVELLA) 100 MG TABS TABLET    Take 1 tablet (100 mg total) by mouth 2 (two) times daily.   TRAZODONE (DESYREL) 100 MG TABLET    Take 100 mg by mouth at bedtime.    TURMERIC 500 MG CAPS    Take 1 capsule by mouth daily.   VORTIOXETINE HBR (TRINTELLIX) 20 MG TABS    Take 20 mg by mouth daily.    Review of Systems  Respiratory: Positive for shortness of breath.   All other systems reviewed and are negative.   Social History  Substance Use Topics  . Smoking status: Former Smoker    Quit date: 03/07/1995  . Smokeless tobacco: Never Used  . Alcohol use No   Objective:   BP 122/71   Pulse (!) 106   Temp 98.4 F (36.9 C)  Wt 163 lb 9.6 oz (74.2 kg)   BMI 28.08 kg/m   Physical Exam  Constitutional: She is oriented to person, place, and time. She appears well-developed and well-nourished.  HENT:  Head: Normocephalic and atraumatic.  Cerumen in ear canal   Eyes: Pupils are equal, round, and reactive to light.  Neck: Normal range of motion. Neck supple.  Cardiovascular: Normal rate, regular rhythm and normal heart sounds.   Pulmonary/Chest: Effort normal and breath sounds normal.  Abdominal: Soft. Bowel sounds are normal.  Neurological: She is alert and oriented to person, place, and time.  Skin: Skin is warm and dry.  Psychiatric: She has a normal mood and affect.  Vitals reviewed.       Assessment & Plan:     1. COPD  - sample of Pulmicort   - RTC in one month for recheck on COPD  2. Ground-glass opacity seen on CT scan  - Chest CT to be repeated 3 months  - needs charity care - will bring forms tomorrow    Will contact [facility] for Fibromyalgia.   FU in 4 months.   [facility]

## 2016-10-14 ENCOUNTER — Telehealth: Payer: Self-pay | Admitting: Pharmacist

## 2016-10-14 NOTE — Telephone Encounter (Signed)
10/14/16 mailed patient [place] form 08/25/16 to sign and return, she has not, I have reprinted today and putting on wall for when patient next picks up med she can sign. I have also reprinted script for Ventolin and will take to [facility], due to script provider previously signed was Aug. 20, and [place] will want a more current script.Delos Haring

## 2016-10-30 ENCOUNTER — Ambulatory Visit: Payer: Managed Care, Other (non HMO) | Admitting: Adult Health Nurse Practitioner

## 2016-10-30 VITALS — BP 131/75 | HR 89 | Temp 98.2°F | Ht 64.0 in | Wt 163.7 lb

## 2016-10-30 DIAGNOSIS — J439 Emphysema, unspecified: Secondary | ICD-10-CM

## 2016-10-30 NOTE — Progress Notes (Signed)
Subjective:    Patient ID: Christina Hurst, female    DOB: Sep 06, 1955, 61 y.o.   MRN: [id number]  HPI  Aaima Gaddie is a 61 yo female here today for her Emphysema.   Patient Active Problem List   Diagnosis Date Noted  . Mild chronic obstructive pulmonary disease ([place]) 05/09/2015  . Vitamin D deficiency 03/08/2015  . Dyspareunia in female 03/07/2015  . Bipolar 2 disorder ([place]) 03/07/2015  . Emphysema of lung ([place]) 03/07/2015  . Fatigue 03/07/2015  . Osteoarthritis 03/07/2015  . Allergic rhinitis 03/07/2015  . Fibromyalgia 10/09/2014   Allergies as of 10/30/2016      Reactions   Codeine Itching, Nausea Only   Prednisone       Medication List       Accurate as of 10/30/16  6:44 PM. Always use your most recent med list.          albuterol 108 (90 Base) MCG/ACT inhaler Commonly known as:  PROVENTIL HFA;VENTOLIN HFA Inhale 2 puffs into the lungs every 4 (four) hours as needed for wheezing or shortness of breath.   albuterol 108 (90 Base) MCG/ACT inhaler Commonly known as:  PROVENTIL HFA;VENTOLIN HFA Inhale 2 puffs into the lungs every 6 (six) hours as needed for wheezing or shortness of breath.   amitriptyline 75 MG tablet Commonly known as:  ELAVIL Take 75 mg by mouth at bedtime.   ascorbic acid 500 MG tablet Commonly known as:  VITAMIN C Take 500 mg by mouth daily.   budesonide-formoterol 80-4.5 MCG/ACT inhaler Commonly known as:  SYMBICORT Inhale 2 puffs into the lungs 2 (two) times daily.   celecoxib 200 MG capsule Commonly known as:  CELEBREX Take 1 capsule (200 mg total) by mouth 2 (two) times daily.   clorazepate 7.5 MG tablet Commonly known as:  TRANXENE Take 1 tablet by mouth daily.   dextromethorphan-guaiFENesin 30-600 MG 12hr tablet Commonly known as:  MUCINEX DM Take 1 tablet by mouth.   fluticasone 50 MCG/ACT nasal spray Commonly known as:  FLONASE Place 1 spray into both nostrils 2 (two) times daily as needed for allergies or  rhinitis.   GNP GINGKO BILOBA EXTRACT 60 MG Caps Generic drug:  Ginkgo Biloba Extract Take 1 capsule by mouth daily.   lamoTRIgine 100 MG tablet Commonly known as:  LAMICTAL Take 100 mg by mouth daily.   loratadine 10 MG tablet Commonly known as:  CLARITIN Take 1 tablet (10 mg total) by mouth daily as needed for allergies.   Milnacipran HCl 100 MG Tabs tablet Commonly known as:  SAVELLA Take 1 tablet (100 mg total) by mouth 2 (two) times daily.   omega-3 acid ethyl esters 1 g capsule Commonly known as:  LOVAZA Take by mouth.   pseudoephedrine-acetaminophen 30-500 MG Tabs tablet Commonly known as:  TYLENOL SINUS Take 1 tablet by mouth every 6 (six) hours as needed.   traZODone 100 MG tablet Commonly known as:  DESYREL Take 100 mg by mouth at bedtime.   TRINTELLIX 20 MG Tabs Generic drug:  vortioxetine HBr Take 20 mg by mouth daily.   Turmeric 500 MG Caps Take 1 capsule by mouth daily.   Vitamin D 2000 units Caps Take 1 capsule (2,000 Units total) by mouth daily.        Review of Systems Pt reports pain in her back and burning when breathing for 2 years. This was relieved with Symbicort.    Pt reports she thinks she will get the Symbicort soon. The  sample she was given at last encounter helped.     Objective:   Physical Exam  Constitutional: She is oriented to person, place, and time. She appears well-developed and well-nourished.  Cardiovascular: Normal rate, regular rhythm and normal heart sounds.   Pulmonary/Chest: Effort normal and breath sounds normal.  Neurological: She is alert and oriented to person, place, and time.  Skin: Skin is warm and dry.  Psychiatric: She has a normal mood and affect.  Vitals reviewed.   BP 131/75 (BP Location: Left Arm, Patient Position: Sitting, Cuff Size: Normal)   Pulse 89   Temp 98.2 F (36.8 C) (Oral)   Ht 5\' 4"  (1.626 m)   Wt 163 lb 11.2 oz (74.3 kg)   BMI 28.10 kg/m        Assessment & Plan:   Keep 3 mo  repeat chest CT. Will call with results or have patient to come back for results review.  F/u in 4 mo for health maintenance.

## 2016-11-04 ENCOUNTER — Telehealth: Payer: Self-pay | Admitting: Pharmacist

## 2016-11-04 NOTE — Telephone Encounter (Signed)
94/50/38 Faxed [facility] application for Ventolin HFA 96mcg Inhale 2 puffs into the lungs every 6 hours as needed for wheezing or shortness of breath. AJ 11/04/16 Faxed [employer] application for renewal on Symbicort 160/4.5 mcg Inhale 2 puffs every day.Christina Hurst

## 2016-11-10 ENCOUNTER — Ambulatory Visit
Admission: RE | Admit: 2016-11-10 | Discharge: 2016-11-10 | Disposition: A | Discharge status: Home / Self Care | Payer: Self-pay | Source: Ambulatory Visit | Attending: Urology | Admitting: Urology

## 2016-11-10 DIAGNOSIS — J439 Emphysema, unspecified: Secondary | ICD-10-CM | POA: Insufficient documentation

## 2016-11-10 DIAGNOSIS — R918 Other nonspecific abnormal finding of lung field: Secondary | ICD-10-CM | POA: Insufficient documentation

## 2016-11-12 ENCOUNTER — Telehealth: Payer: Self-pay | Admitting: Pharmacist

## 2016-11-12 NOTE — Telephone Encounter (Signed)
11/12/16 Called [facility] for refill on Savella 100mg , order # [id number] 7-10 business days to receive.Christina Hurst

## 2016-11-17 ENCOUNTER — Telehealth: Payer: Self-pay | Admitting: Pharmacist

## 2016-11-17 NOTE — Telephone Encounter (Signed)
11/17/16 We have received a denial from [place] stating patient exceeds income guidelines. I have discussed an option with Christan-for Proventil HFA Inhale 2 puffs every 6 hours as needed-I have printed [employer] application and mailing patient her portion to sign and return, also sending provider portion to [facility] for Dr. Mable Fill to sign.Christina Hurst

## 2016-11-20 ENCOUNTER — Telehealth: Payer: Self-pay

## 2016-11-20 NOTE — Telephone Encounter (Signed)
Called pt to give results. No answer left msg.

## 2016-11-20 NOTE — Telephone Encounter (Signed)
-----   Message from Nori Riis, PA-C sent at 11/13/2016  7:55 PM EST ----- Please let Mrs. Micheli know her chest CT is normal.

## 2016-11-26 ENCOUNTER — Telehealth: Payer: Self-pay

## 2016-11-26 NOTE — Telephone Encounter (Signed)
Placed signed application/script in [facility] folder for pickup. 

## 2016-11-26 NOTE — Telephone Encounter (Signed)
Received PAP application from [facility] for proventil HFA placed for provider to sign.

## 2017-01-02 ENCOUNTER — Telehealth: Payer: Self-pay | Admitting: Pharmacist

## 2017-01-02 NOTE — Telephone Encounter (Signed)
01/02/17 Called [facility] for refill on Trintellix 20.Christina Hurst

## 2017-01-13 ENCOUNTER — Ambulatory Visit: Payer: Managed Care, Other (non HMO) | Admitting: Family Medicine

## 2017-01-13 ENCOUNTER — Telehealth: Payer: Self-pay | Admitting: Pharmacist

## 2017-01-13 VITALS — BP 129/70 | HR 89 | Temp 98.0°F | Wt 165.0 lb

## 2017-01-13 DIAGNOSIS — J439 Emphysema, unspecified: Secondary | ICD-10-CM

## 2017-01-13 DIAGNOSIS — L989 Disorder of the skin and subcutaneous tissue, unspecified: Secondary | ICD-10-CM

## 2017-01-13 MED ORDER — BUDESONIDE-FORMOTEROL FUMARATE 80-4.5 MCG/ACT IN AERO: 2.0000 | INHALATION_SPRAY | Freq: Two times a day (BID) | RESPIRATORY_TRACT | 2 refills | Status: DC

## 2017-01-13 MED ORDER — ALBUTEROL SULFATE HFA 108 (90 BASE) MCG/ACT IN AERS: 2.0000 | INHALATION_SPRAY | Freq: Four times a day (QID) | RESPIRATORY_TRACT | 2 refills | Status: DC | PRN

## 2017-01-13 NOTE — Telephone Encounter (Addendum)
I re-ordered Albuterol and Symbicort tonight in clinic.  Patient did not remember receiving this paperwork.  Are you able to re-send to her?  If Symbicort approved or do we need to send an alternative daily medication? Thanks!

## 2017-01-13 NOTE — Telephone Encounter (Signed)
01/13/17 Mailing denial letter to patient for PROVENTIL HFA, patient has failed to sign and return paperwork mailed to her 11/17/16 for [place] to order this medication.Delos Haring

## 2017-01-13 NOTE — Progress Notes (Signed)
Name: Christina Hurst   MRN: [id number]    DOB: 1955-12-20   Date:01/13/2017       Progress Note  Subjective  Chief Complaint  Chief Complaint  Patient presents with  . Follow-up  . Back Pain  . COPD    needs to try new inhaler    HPI  Skin Lesions: Has multiple lesions to the face that she is concerned about, particularly an area on her nose that has been present for over a year and looks like a sore that has not been healing. Has several family members with melanoma.  We will refer to derm.  Emphysema: Had stable Chest CT on 11/10/2016.  She did not get approval for her Symbicort or for her Albuterol - it appears that she was mailed paperwork but she never completed it - will see if we can resend this paperwork .  Will re-order medications today, but will change if needed based on approval/denial of assistance.  She endorses chronic shortness of breath, worse with exertion.  She does have some albuterol left which she uses PRN when resting does not help.  Denies chest pain or palpitations.  Patient Active Problem List   Diagnosis Date Noted  . Mild chronic obstructive pulmonary disease ([place]) 05/09/2015  . Vitamin D deficiency 03/08/2015  . Dyspareunia in female 03/07/2015  . Bipolar 2 disorder ([place]) 03/07/2015  . Emphysema of lung ([place]) 03/07/2015  . Fatigue 03/07/2015  . Osteoarthritis 03/07/2015  . Allergic rhinitis 03/07/2015  . Fibromyalgia 10/09/2014    Past Surgical History:  Procedure Laterality Date  . CESAREAN SECTION      Family History  Problem Relation Age of Onset  . Depression Mother   . Arthritis Mother   . Schizophrenia Mother   . Hepatitis C Father   . Cancer Maternal Grandmother   . Bipolar disorder Sister     Social History   Socioeconomic History  . Marital status: Married    Spouse name: Not on file  . Number of children: Not on file  . Years of education: Not on file  . Highest education level: Not on file  Social Needs  . Financial  resource strain: Not on file  . Food insecurity - worry: Not on file  . Food insecurity - inability: Not on file  . Transportation needs - medical: Not on file  . Transportation needs - non-medical: Not on file  Occupational History  . Not on file  Tobacco Use  . Smoking status: Former Smoker    Last attempt to quit: 03/07/1995    Years since quitting: 21.8  . Smokeless tobacco: Never Used  Substance and Sexual Activity  . Alcohol use: No    Alcohol/week: 1.8 oz    Types: 3 Cans of beer per week  . Drug use: No  . Sexual activity: Not on file  Other Topics Concern  . Not on file  Social History Narrative  . Not on file     Current Outpatient Medications:  .  albuterol (PROVENTIL HFA;VENTOLIN HFA) 108 (90 Base) MCG/ACT inhaler, Inhale 2 puffs into the lungs every 4 (four) hours as needed for wheezing or shortness of breath., Disp: 1 Inhaler, Rfl: 0 .  albuterol (PROVENTIL HFA;VENTOLIN HFA) 108 (90 Base) MCG/ACT inhaler, Inhale 2 puffs into the lungs every 6 (six) hours as needed for wheezing or shortness of breath., Disp: 1 Inhaler, Rfl: 2 .  amitriptyline (ELAVIL) 75 MG tablet, Take 75 mg by mouth at bedtime., Disp: ,  Rfl:  .  celecoxib (CELEBREX) 200 MG capsule, Take 1 capsule (200 mg total) by mouth 2 (two) times daily., Disp: 60 capsule, Rfl: 0 .  clorazepate (TRANXENE) 7.5 MG tablet, Take 1 tablet by mouth daily., Disp: , Rfl:  .  dextromethorphan-guaiFENesin (MUCINEX DM) 30-600 MG 12hr tablet, Take 1 tablet by mouth., Disp: , Rfl:  .  lamoTRIgine (LAMICTAL) 100 MG tablet, Take 100 mg by mouth daily., Disp: , Rfl:  .  Milnacipran HCl (SAVELLA) 100 MG TABS tablet, Take 1 tablet (100 mg total) by mouth 2 (two) times daily., Disp: 60 tablet, Rfl: 0 .  pseudoephedrine-acetaminophen (TYLENOL SINUS) 30-500 MG TABS tablet, Take 1 tablet by mouth every 6 (six) hours as needed., Disp: , Rfl:  .  traZODone (DESYREL) 100 MG tablet, Take 100 mg by mouth at bedtime. , Disp: , Rfl:  .   Turmeric 500 MG CAPS, Take 1 capsule by mouth daily., Disp: , Rfl:  .  Vortioxetine HBr (TRINTELLIX) 20 MG TABS, Take 20 mg by mouth daily., Disp: , Rfl:  .  ascorbic acid (VITAMIN C) 500 MG tablet, Take 500 mg by mouth daily., Disp: , Rfl:  .  budesonide-formoterol (SYMBICORT) 80-4.5 MCG/ACT inhaler, Inhale 2 puffs into the lungs 2 (two) times daily. (Patient not taking: Reported on 01/13/2017), Disp: 1 Inhaler, Rfl: 0 .  Cholecalciferol (VITAMIN D) 2000 units CAPS, Take 1 capsule (2,000 Units total) by mouth daily. (Patient not taking: Reported on 01/13/2017), Disp: 30 capsule, Rfl:  .  fluticasone (FLONASE) 50 MCG/ACT nasal spray, Place 1 spray into both nostrils 2 (two) times daily as needed for allergies or rhinitis. (Patient not taking: Reported on 01/13/2017), Disp: 16 g, Rfl: 0 .  Ginkgo Biloba Extract (GNP GINGKO BILOBA EXTRACT) 60 MG CAPS, Take 1 capsule by mouth daily., Disp: , Rfl:  .  loratadine (CLARITIN) 10 MG tablet, Take 1 tablet (10 mg total) by mouth daily as needed for allergies. (Patient not taking: Reported on 01/13/2017), Disp: 30 tablet, Rfl: 0 .  omega-3 acid ethyl esters (LOVAZA) 1 g capsule, Take by mouth., Disp: , Rfl:   Allergies  Allergen Reactions  . Codeine Itching and Nausea Only  . Prednisone     Contraindicated by Psych Medicatoins     ROS  Constitutional: Negative for fever or weight change.  Respiratory: See HPI Cardiovascular: Negative for chest pain or palpitations.  Gastrointestinal: Negative for abdominal pain, no bowel changes.  Musculoskeletal: Negative for gait problem or joint swelling.  Skin: Negative for rash. See HPI Neurological: Negative for dizziness or headache.  No other specific complaints in a complete review of systems (except as listed in HPI above).   Objective  Vitals:   01/13/17 1925  BP: 129/70  Pulse: 89  Temp: 98 F (36.7 C)  Weight: 165 lb (74.8 kg)   Body mass index is 28.32 kg/m.  Physical Exam Constitutional:  Patient appears well-developed and well-nourished. No distress.  HENT: Head: Normocephalic and atraumatic.  Neck: Normal range of motion. Neck supple. No JVD present. Cardiovascular: Normal rate, regular rhythm and normal heart sounds.  No murmur heard. No BLE edema. Pulmonary/Chest: Effort normal and breath sounds normal. No respiratory distress. Skin: Skin is warm and dry. No rash noted. No erythema. small erythematous, mildly tender lesion to the mid shaft of the nose; LEFT cheek also has pale flesh-colored lesion. Psychiatric: Patient has a normal mood and affect. behavior is normal. Judgment and thought content normal.  No results found for this or  any previous visit (from the past 2160 hour(s)).   PHQ2/9: Depression screen PHQ 2/9 06/22/2015  Decreased Interest 3  Down, Depressed, Hopeless 2  PHQ - 2 Score 5  Altered sleeping 1  Tired, decreased energy 3  Change in appetite 2  Feeling bad or failure about yourself  2  Trouble concentrating 0  Moving slowly or fidgety/restless 1  Suicidal thoughts 0  PHQ-9 Score 14  Difficult doing work/chores Somewhat difficult    Assessment & Plan  1. Pulmonary emphysema, unspecified emphysema type ([place]) - albuterol (PROVENTIL HFA;VENTOLIN HFA) 108 (90 Base) MCG/ACT inhaler; Inhale 2 puffs into the lungs every 6 (six) hours as needed for wheezing or shortness of breath.  Dispense: 1 Inhaler; Refill: 2 - budesonide-formoterol (SYMBICORT) 80-4.5 MCG/ACT inhaler; Inhale 2 puffs into the lungs 2 (two) times daily.  Dispense: 1 Inhaler; Refill: 2  2. Skin lesion of face - Ambulatory referral to Dermatology

## 2017-01-15 ENCOUNTER — Telehealth: Payer: Self-pay | Admitting: Pharmacist

## 2017-01-15 NOTE — Telephone Encounter (Signed)
01/15/17 Received pharmacy printout for Dose Change- Symbicort 80/4.5 mcg Inhale 2 puffs into the lungs 2 times a day. I am added in med list, printed script to take to [facility] for provider to sign. Delos Haring

## 2017-01-27 ENCOUNTER — Ambulatory Visit: Payer: Managed Care, Other (non HMO)

## 2017-02-04 ENCOUNTER — Telehealth: Payer: Self-pay | Admitting: Pharmacist

## 2017-02-04 NOTE — Telephone Encounter (Signed)
02/04/17 Called [facility] for refill on Savella 100mg , to release 02/06/17, order# [id number], allow 7-10 business days to receive.Delos Haring

## 2017-02-13 ENCOUNTER — Telehealth: Payer: Self-pay | Admitting: Pharmacist

## 2017-02-13 NOTE — Telephone Encounter (Signed)
02/13/17 Faxed script to [facility] for Dose Change- Symbicort 80/4.5 mcg Inhale 2 puffs into the lungs two times a day.Delos Haring

## 2017-02-19 ENCOUNTER — Telehealth: Payer: Self-pay | Admitting: Pharmacy Technician

## 2017-02-19 ENCOUNTER — Telehealth: Payer: Self-pay | Admitting: Pharmacist

## 2017-02-19 NOTE — Telephone Encounter (Signed)
02/19/17 Mailing [employer] application for [employer] HFA 0.09mg  Inhale 2 puffs every 6 hours as needed for processing.AJ   02/19/17 When patient came by office 02/10/17, she signed her portion of application and brought [place] copies of application she had taken to Dr. Teryl Lucy to sign. I explained to patient that I needed to original, she will take back to doctor to sign. A few days later I received a faxed letter from [employer] dated 02-09-17 stating they had received application and was missing financial information. Apparently the provider office faxed application to [place]. I have today faxed financial information 2017 taxes to [place] to see if the completes processing.AJ   02/19/17 When patient came by office on 02-10-17 she brought in a PAP application she had received from [place], she had completed and signed her portion. I have filled in provider portion and taking to Dr. Karalee Height to sign for Trintellix 20mg  Take 1 tablet by mouth everyday. Bernita Buffy requires that patient re enroll in Jan. for assistance.AJ   02/19/17 Received a call from [facility] on the script I faxed to them 02/13/17 for Dose Change-Symbicor 80/4.15mcg Inhale 2 puffs into the lungs two times a day #3. They indicated that they need a New application with current signatures, need a letter explaining that patient is no longer working, Pension scheme manager for current income, DO NOT SEND 1040. I have typed the letter about financial, mailing patient her portion to sign & return, also taking Dr. Mable Fill @ [facility] his portion of application to sign.Delos Haring

## 2017-02-19 NOTE — Telephone Encounter (Signed)
Patient still needs to provide 2018 tax return and bank statements for 2019.  [facility]

## 2017-02-24 ENCOUNTER — Telehealth: Payer: Self-pay | Admitting: Pharmacy Technician

## 2017-02-24 NOTE — Telephone Encounter (Signed)
Patient's household income exceeds 250% [employer].  No longer meets the eligibility criteria for [facility].  Patient notified by letter.  [facility]

## 2017-03-05 ENCOUNTER — Ambulatory Visit: Payer: Managed Care, Other (non HMO) | Admitting: Adult Health Nurse Practitioner

## 2017-03-05 VITALS — BP 111/75 | HR 91 | Temp 98.2°F | Wt 160.1 lb

## 2017-03-05 DIAGNOSIS — M150 Primary generalized (osteo)arthritis: Principal | ICD-10-CM

## 2017-03-05 DIAGNOSIS — J439 Emphysema, unspecified: Secondary | ICD-10-CM

## 2017-03-05 DIAGNOSIS — M159 Polyosteoarthritis, unspecified: Secondary | ICD-10-CM

## 2017-03-05 MED ORDER — IBUPROFEN 800 MG PO TABS: 800.0000 mg | ORAL_TABLET | Freq: Three times a day (TID) | ORAL | 0 refills | Status: AC | PRN

## 2017-03-05 MED ORDER — ALBUTEROL SULFATE HFA 108 (90 BASE) MCG/ACT IN AERS: 2.0000 | INHALATION_SPRAY | Freq: Four times a day (QID) | RESPIRATORY_TRACT | 2 refills | Status: AC | PRN

## 2017-03-05 MED ORDER — BUDESONIDE-FORMOTEROL FUMARATE 80-4.5 MCG/ACT IN AERO: 2.0000 | INHALATION_SPRAY | Freq: Two times a day (BID) | RESPIRATORY_TRACT | 2 refills | Status: AC

## 2017-03-05 NOTE — Patient Instructions (Signed)

## 2017-03-05 NOTE — Progress Notes (Signed)
Subjective:    Patient ID: Christina Hurst, female    DOB: 01-Nov-1955, 62 y.o.   MRN: [id number]  HPI  Maddalena Linarez is a 62 yo female here for arthritis pain and COPD.  Pt reports she became ineligible for [employer] and so she's been unable to get her meds. She is out of her inhalers, Symbicort and Albuterol.  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 She needs it removed but is unsure if she has charity care.  Arthritis Knee pain - pt reports its chronic and it's increasing shooting pain. She reports it gives out when walking. She takes Celebrex for the pain but is out. She reports Alleve helps a little.   Patient Active Problem List   Diagnosis Date Noted  . Mild chronic obstructive pulmonary disease ([place]) 05/09/2015  . Vitamin D deficiency 03/08/2015  . Dyspareunia in female 03/07/2015  . Bipolar 2 disorder ([place]) 03/07/2015  . Emphysema of lung ([place]) 03/07/2015  . Fatigue 03/07/2015  . Osteoarthritis 03/07/2015  . Allergic rhinitis 03/07/2015  . Fibromyalgia 10/09/2014   Allergies as of 03/05/2017      Reactions   Codeine Itching, Nausea Only   Prednisone    Contraindicated by Psych Medicatoins      Medication List        Accurate as of 03/05/17  6:12 PM. Always use your most recent med list.          albuterol 108 (90 Base) MCG/ACT inhaler Commonly known as:  PROVENTIL HFA;VENTOLIN HFA Inhale 2 puffs into the lungs every 6 (six) hours as needed for wheezing or shortness of breath.   amitriptyline 75 MG tablet Commonly known as:  ELAVIL Take 75 mg by mouth at bedtime.   ascorbic acid 500 MG tablet Commonly known as:  VITAMIN C Take 500 mg by mouth daily.   budesonide-formoterol 80-4.5 MCG/ACT inhaler Commonly known as:  SYMBICORT Inhale 2 puffs into the lungs 2 (two) times daily.   celecoxib 200 MG capsule Commonly known as:  CELEBREX Take 1 capsule (200 mg total) by mouth 2 (two) times  daily.   clorazepate 7.5 MG tablet Commonly known as:  TRANXENE Take 1 tablet by mouth daily.   dextromethorphan-guaiFENesin 30-600 MG 12hr tablet Commonly known as:  MUCINEX DM Take 1 tablet by mouth.   GNP GINGKO BILOBA EXTRACT 60 MG Caps Generic drug:  Ginkgo Biloba Extract Take 1 capsule by mouth daily.   lamoTRIgine 100 MG tablet Commonly known as:  LAMICTAL Take 100 mg by mouth daily.   Milnacipran HCl 100 MG Tabs tablet Commonly known as:  SAVELLA Take 1 tablet (100 mg total) by mouth 2 (two) times daily.   omega-3 acid ethyl esters 1 g capsule Commonly known as:  LOVAZA Take by mouth.   pseudoephedrine-acetaminophen 30-500 MG Tabs tablet Commonly known as:  TYLENOL SINUS Take 1 tablet by mouth every 6 (six) hours as needed.   traZODone 100 MG tablet Commonly known as:  DESYREL Take 100 mg by mouth at bedtime.   TRINTELLIX 20 MG Tabs tablet Generic drug:  vortioxetine HBr Take 20 mg by mouth daily.   Turmeric 500 MG Caps Take 1 capsule by mouth daily.        Review of Systems  All other systems reviewed and are negative.      Objective:   Physical Exam  Constitutional: She is oriented to person, place, and time.  She appears well-developed and well-nourished.  Cardiovascular: Normal rate, regular rhythm and normal heart sounds.  Pulmonary/Chest: Effort normal and breath sounds normal.  Abdominal: Soft. Bowel sounds are normal.  Neurological: She is alert and oriented to person, place, and time.  Vitals reviewed.   BP 111/75 (BP Location: Left Arm, Patient Position: Sitting, Cuff Size: Normal)   Pulse 91   Temp 98.2 F (36.8 C)   Wt 160 lb 1.6 oz (72.6 kg)   BMI 27.48 kg/m        Assessment & Plan:   Knee pain: Ordered 800mg  Ibuprofen. Refer to Dr. Vickki Hearing, ortho. Gave pt info for exercises and heating and icing.   Proventil samples given.   Keep 05/12/17 apt.

## 2017-03-12 ENCOUNTER — Telehealth: Payer: Self-pay | Admitting: Adult Health Nurse Practitioner

## 2017-03-12 ENCOUNTER — Other Ambulatory Visit: Payer: Self-pay | Admitting: Adult Health Nurse Practitioner

## 2017-03-12 DIAGNOSIS — M150 Primary generalized (osteo)arthritis: Principal | ICD-10-CM

## 2017-03-12 DIAGNOSIS — M159 Polyosteoarthritis, unspecified: Secondary | ICD-10-CM

## 2017-03-12 MED ORDER — CELECOXIB 200 MG PO CAPS: 200.0000 mg | ORAL_CAPSULE | Freq: Two times a day (BID) | ORAL | 2 refills | Status: AC

## 2017-03-12 NOTE — Telephone Encounter (Signed)
Pt wants to schedule an orthro appt.

## 2017-03-13 ENCOUNTER — Telehealth: Payer: Self-pay

## 2017-03-13 NOTE — Telephone Encounter (Signed)
Lm with ortho appt date and time. Also stated medication refill was sent to her pharmacy

## 2017-04-01 ENCOUNTER — Ambulatory Visit
Admission: RE | Admit: 2017-04-01 | Discharge: 2017-04-01 | Disposition: A | Discharge status: Home / Self Care | Payer: Self-pay | Source: Ambulatory Visit | Attending: Specialist | Admitting: Specialist

## 2017-04-01 ENCOUNTER — Ambulatory Visit: Payer: Managed Care, Other (non HMO) | Admitting: Specialist

## 2017-04-01 DIAGNOSIS — M170 Bilateral primary osteoarthritis of knee: Secondary | ICD-10-CM

## 2017-04-01 NOTE — Progress Notes (Signed)
   Subjective:    Patient ID: Christina Hurst, female    DOB: Dec 03, 1955, 62 y.o.   MRN: [id number]  HPI Bil knee pain for several years. At times, she has to do stairs one step at time. She has more trouble desending the stairs. In general, she is able to walk for pleasure.  She was on Celebrex but could not afford the medication. She currently is on no medication   Review of Systems     Objective:   Physical Exam Her gait is deliberate but not antalgic. On inspection, she has minimal Genu Valgum on the .right and slightly more on the left.  She is able to take a few steps on her toes and heels. Sitting, she has full active extension. Her "Q" angle is increased bil. There is no effusion. Supine, she has diminished pattellar MOBs on the left. Supine she has full flexion. There is no M/L laxity. With flexion/rotation, there was no clicking or pain reproduced.       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

## 2017-05-06 ENCOUNTER — Ambulatory Visit: Payer: Managed Care, Other (non HMO) | Admitting: Specialist

## 2017-05-06 DIAGNOSIS — M159 Polyosteoarthritis, unspecified: Secondary | ICD-10-CM

## 2017-05-06 NOTE — Progress Notes (Signed)
   Subjective:    Patient ID: Christina Hurst, female    DOB: 1955/04/30, 62 y.o.   MRN: [id number]  HPI X-ray's reveal mild OA BIL. She is again taking NSAID's.    Review of Systems     Objective:   Physical Exam        Assessment & Plan:  Return PRN.

## 2017-05-12 ENCOUNTER — Telehealth: Payer: Self-pay | Admitting: Adult Health Nurse Practitioner

## 2017-05-12 ENCOUNTER — Ambulatory Visit: Payer: Managed Care, Other (non HMO)

## 2017-05-12 NOTE — Telephone Encounter (Signed)
Cancelled 5/7 appointment due to family emergency. Requested call back to reschedule. Have not called back yet.

## 2017-05-13 ENCOUNTER — Telehealth: Payer: Self-pay

## 2017-05-13 NOTE — Telephone Encounter (Signed)
Pt lm wanting to r/s appt. Called and no answer. Left I will try again later

## 2019-06-07 IMAGING — CR DG KNEE STANDING AP BILAT
1 series · 1 of 1 positions shown · non-contrast
Comparison: None

CLINICAL DATA: Chronic BILATERAL knee pain

EXAM:
BILATERAL KNEES STANDING - 1 VIEW; RIGHT KNEE 3 VIEWS; LEFT KNEE 3
VIEWS

[knee ap bilat standing]
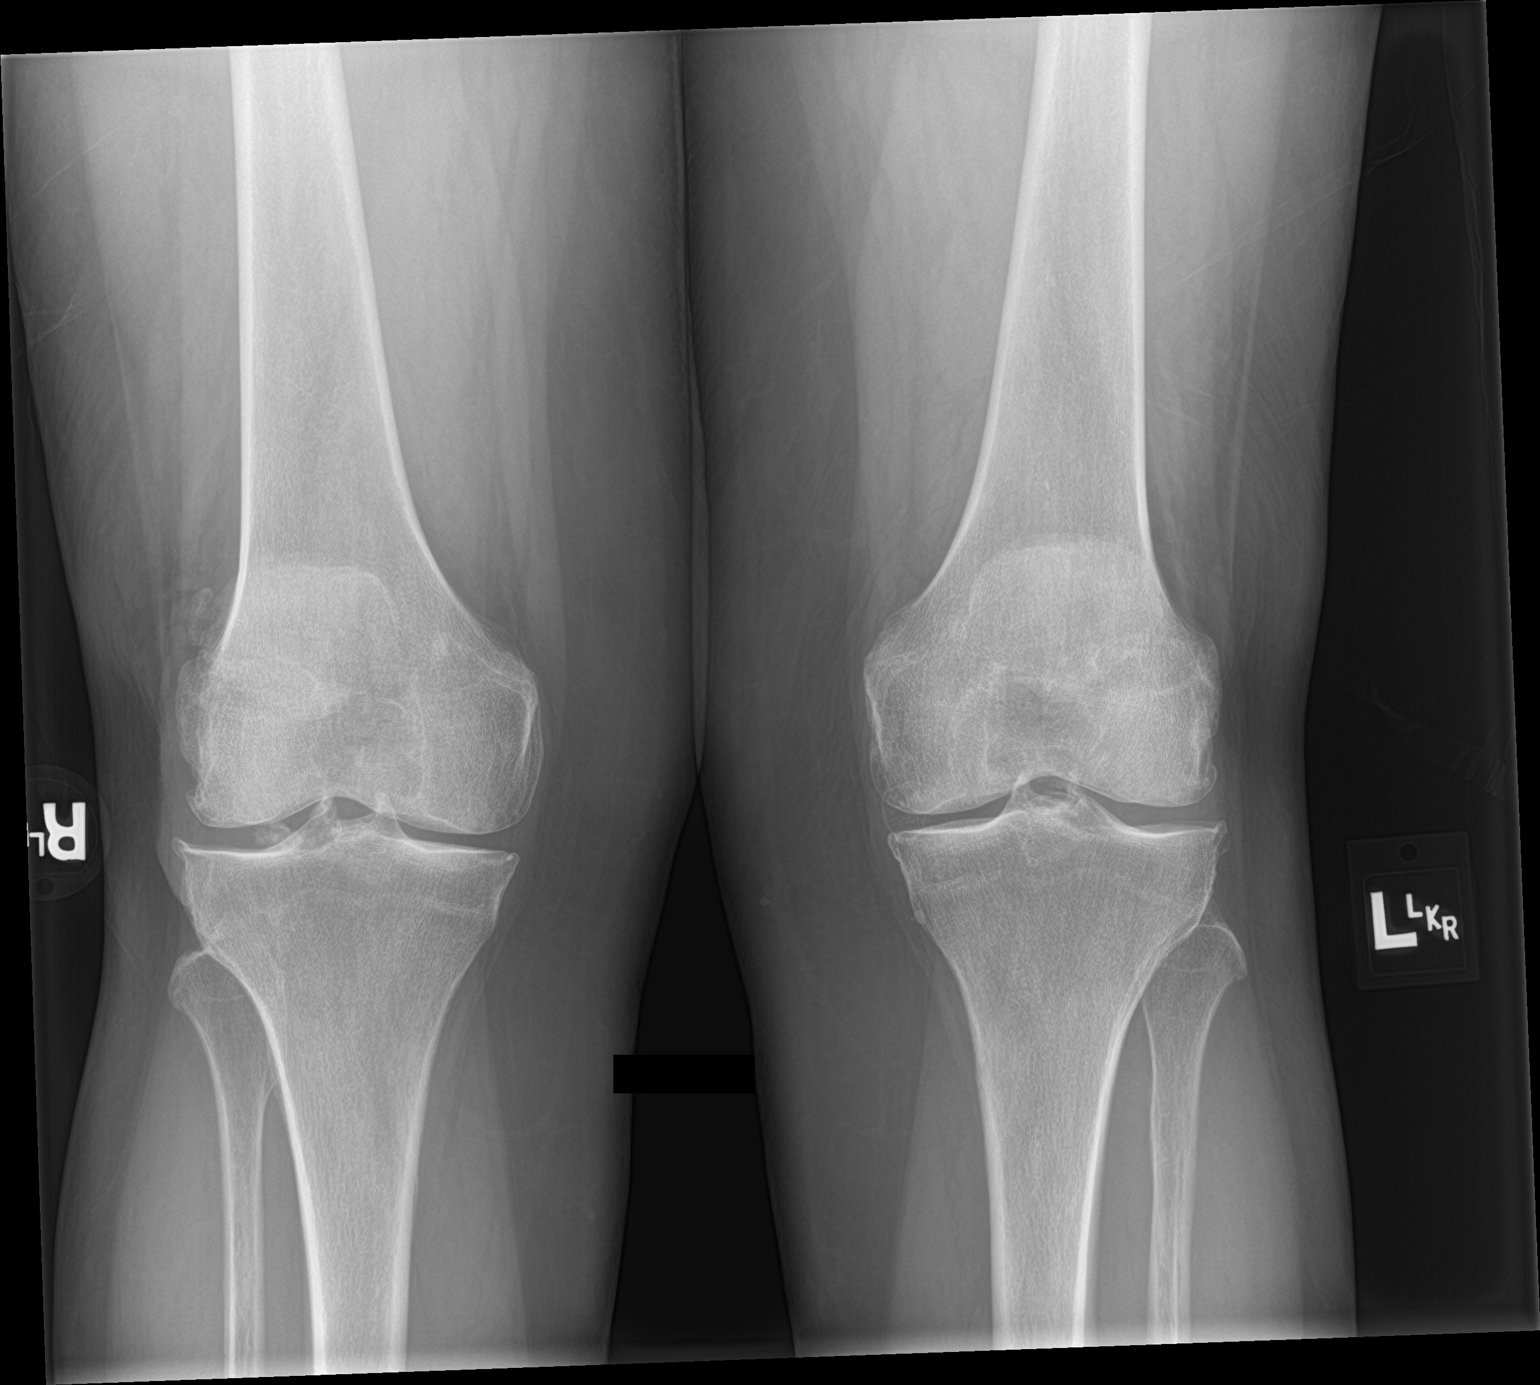

[1 of 1 positions shown; findings below may reference images not displayed]

FINDINGS: RIGHT knee:

Osseous demineralization.

Diffuse joint space narrowing on standing view with marginal spurs
greatest at lateral compartment and patellofemoral joint.

No acute fracture, dislocation, or bone destruction.

No knee joint effusion.

LEFT knee:

Osseous demineralization.

Diffuse joint space narrowing on upright view with marginal spurs at
the lateral compartment and patellofemoral joint.

No acute fracture, dislocation, or bone destruction.

No knee joint effusion.
IMPRESSION: Osteoarthritic changes of both knees.

## 2022-08-22 ENCOUNTER — Other Ambulatory Visit: Payer: Self-pay
